# Patient Record
Sex: Female | Born: 1965
Health system: Southern US, Community
[De-identification: ages and names within clinical notes are randomized; demographics above are authoritative.]

## PROBLEM LIST (undated history)

## (undated) DIAGNOSIS — Z8669 Personal history of other diseases of the nervous system and sense organs: Secondary | ICD-10-CM

## (undated) HISTORY — DX: Personal history of other diseases of the nervous system and sense organs: Z86.69

## (undated) HISTORY — PX: WISDOM TOOTH EXTRACTION: SHX21

---

## 2001-04-11 ENCOUNTER — Emergency Department (HOSPITAL_COMMUNITY): Admission: EM | Admit: 2001-04-11 | Discharge: 2001-04-11 | Payer: Self-pay | Admitting: Emergency Medicine

## 2002-01-03 ENCOUNTER — Ambulatory Visit (HOSPITAL_COMMUNITY): Admission: RE | Admit: 2002-01-03 | Discharge: 2002-01-03 | Payer: Self-pay | Admitting: Family Medicine

## 2002-01-03 ENCOUNTER — Encounter: Payer: Self-pay | Admitting: Family Medicine

## 2002-01-11 ENCOUNTER — Ambulatory Visit (HOSPITAL_COMMUNITY): Admission: RE | Admit: 2002-01-11 | Discharge: 2002-01-11 | Payer: Self-pay | Admitting: Gynecology

## 2002-01-11 ENCOUNTER — Encounter: Payer: Self-pay | Admitting: Gynecology

## 2002-02-12 ENCOUNTER — Encounter: Payer: Self-pay | Admitting: Obstetrics and Gynecology

## 2002-02-12 ENCOUNTER — Encounter: Admission: RE | Admit: 2002-02-12 | Discharge: 2002-02-12 | Payer: Self-pay | Admitting: Obstetrics and Gynecology

## 2003-03-05 ENCOUNTER — Encounter: Payer: Self-pay | Admitting: Obstetrics and Gynecology

## 2003-03-05 ENCOUNTER — Encounter: Admission: RE | Admit: 2003-03-05 | Discharge: 2003-03-05 | Payer: Self-pay | Admitting: Obstetrics and Gynecology

## 2003-03-07 ENCOUNTER — Encounter: Admission: RE | Admit: 2003-03-07 | Discharge: 2003-03-07 | Payer: Self-pay | Admitting: Obstetrics and Gynecology

## 2003-03-07 ENCOUNTER — Encounter: Payer: Self-pay | Admitting: Obstetrics and Gynecology

## 2004-06-16 ENCOUNTER — Encounter: Admission: RE | Admit: 2004-06-16 | Discharge: 2004-06-16 | Payer: Self-pay | Admitting: Obstetrics and Gynecology

## 2005-06-21 ENCOUNTER — Encounter: Admission: RE | Admit: 2005-06-21 | Discharge: 2005-06-21 | Payer: Self-pay | Admitting: *Deleted

## 2006-06-28 ENCOUNTER — Encounter: Admission: RE | Admit: 2006-06-28 | Discharge: 2006-06-28 | Payer: Self-pay | Admitting: *Deleted

## 2007-10-09 ENCOUNTER — Encounter: Admission: RE | Admit: 2007-10-09 | Discharge: 2007-10-09 | Payer: Self-pay | Admitting: Family Medicine

## 2009-05-05 ENCOUNTER — Encounter: Admission: RE | Admit: 2009-05-05 | Discharge: 2009-05-05 | Payer: Self-pay | Admitting: Family Medicine

## 2010-06-30 ENCOUNTER — Encounter: Admission: RE | Admit: 2010-06-30 | Discharge: 2010-06-30 | Payer: Self-pay | Admitting: Family Medicine

## 2011-11-03 ENCOUNTER — Other Ambulatory Visit: Payer: Self-pay | Admitting: Family Medicine

## 2011-11-03 DIAGNOSIS — Z1231 Encounter for screening mammogram for malignant neoplasm of breast: Secondary | ICD-10-CM

## 2011-12-02 ENCOUNTER — Ambulatory Visit
Admission: RE | Admit: 2011-12-02 | Discharge: 2011-12-02 | Disposition: A | Discharge status: Home / Self Care | Payer: BC Managed Care – PPO | Source: Ambulatory Visit | Attending: Family Medicine | Admitting: Family Medicine

## 2011-12-02 DIAGNOSIS — Z1231 Encounter for screening mammogram for malignant neoplasm of breast: Secondary | ICD-10-CM

## 2011-12-05 ENCOUNTER — Other Ambulatory Visit: Payer: Self-pay | Admitting: Family Medicine

## 2011-12-05 DIAGNOSIS — R928 Other abnormal and inconclusive findings on diagnostic imaging of breast: Secondary | ICD-10-CM

## 2011-12-06 ENCOUNTER — Ambulatory Visit
Admission: RE | Admit: 2011-12-06 | Discharge: 2011-12-06 | Disposition: A | Discharge status: Home / Self Care | Payer: BC Managed Care – PPO | Source: Ambulatory Visit | Attending: Family Medicine | Admitting: Family Medicine

## 2011-12-06 DIAGNOSIS — R928 Other abnormal and inconclusive findings on diagnostic imaging of breast: Secondary | ICD-10-CM

## 2012-05-14 ENCOUNTER — Other Ambulatory Visit: Payer: Self-pay | Admitting: Family Medicine

## 2012-05-14 DIAGNOSIS — R921 Mammographic calcification found on diagnostic imaging of breast: Secondary | ICD-10-CM

## 2012-06-07 ENCOUNTER — Ambulatory Visit
Admission: RE | Admit: 2012-06-07 | Discharge: 2012-06-07 | Disposition: A | Discharge status: Home / Self Care | Payer: BC Managed Care – PPO | Source: Ambulatory Visit | Attending: Family Medicine | Admitting: Family Medicine

## 2012-06-07 DIAGNOSIS — R921 Mammographic calcification found on diagnostic imaging of breast: Secondary | ICD-10-CM

## 2012-11-06 ENCOUNTER — Other Ambulatory Visit: Payer: Self-pay | Admitting: Family Medicine

## 2012-11-06 DIAGNOSIS — R921 Mammographic calcification found on diagnostic imaging of breast: Secondary | ICD-10-CM

## 2012-12-10 ENCOUNTER — Ambulatory Visit
Admission: RE | Admit: 2012-12-10 | Discharge: 2012-12-10 | Disposition: A | Discharge status: Home / Self Care | Payer: BC Managed Care – PPO | Source: Ambulatory Visit | Attending: Family Medicine | Admitting: Family Medicine

## 2012-12-10 DIAGNOSIS — R921 Mammographic calcification found on diagnostic imaging of breast: Secondary | ICD-10-CM

## 2013-11-26 ENCOUNTER — Other Ambulatory Visit: Payer: Self-pay

## 2013-11-26 DIAGNOSIS — Z1231 Encounter for screening mammogram for malignant neoplasm of breast: Secondary | ICD-10-CM

## 2013-12-12 ENCOUNTER — Ambulatory Visit: Payer: BC Managed Care – PPO

## 2013-12-24 ENCOUNTER — Encounter (INDEPENDENT_AMBULATORY_CARE_PROVIDER_SITE_OTHER): Payer: Self-pay

## 2013-12-24 ENCOUNTER — Ambulatory Visit
Admission: RE | Admit: 2013-12-24 | Discharge: 2013-12-24 | Disposition: A | Discharge status: Home / Self Care | Payer: BC Managed Care – PPO | Source: Ambulatory Visit

## 2013-12-24 DIAGNOSIS — Z1231 Encounter for screening mammogram for malignant neoplasm of breast: Secondary | ICD-10-CM

## 2013-12-25 ENCOUNTER — Other Ambulatory Visit: Payer: Self-pay | Admitting: Family Medicine

## 2013-12-25 DIAGNOSIS — R928 Other abnormal and inconclusive findings on diagnostic imaging of breast: Secondary | ICD-10-CM

## 2014-01-06 ENCOUNTER — Ambulatory Visit
Admission: RE | Admit: 2014-01-06 | Discharge: 2014-01-06 | Disposition: A | Discharge status: Home / Self Care | Payer: BC Managed Care – PPO | Source: Ambulatory Visit | Attending: Family Medicine | Admitting: Family Medicine

## 2014-01-06 DIAGNOSIS — R928 Other abnormal and inconclusive findings on diagnostic imaging of breast: Secondary | ICD-10-CM

## 2014-12-26 ENCOUNTER — Other Ambulatory Visit: Payer: Self-pay

## 2014-12-26 DIAGNOSIS — Z1231 Encounter for screening mammogram for malignant neoplasm of breast: Secondary | ICD-10-CM

## 2015-01-22 ENCOUNTER — Ambulatory Visit: Payer: Self-pay

## 2015-02-18 ENCOUNTER — Ambulatory Visit
Admission: RE | Admit: 2015-02-18 | Discharge: 2015-02-18 | Disposition: A | Discharge status: Home / Self Care | Payer: 59 | Source: Ambulatory Visit

## 2015-02-18 DIAGNOSIS — Z1231 Encounter for screening mammogram for malignant neoplasm of breast: Secondary | ICD-10-CM

## 2015-06-24 ENCOUNTER — Encounter: Payer: Self-pay | Admitting: Obstetrics and Gynecology

## 2015-07-08 ENCOUNTER — Ambulatory Visit (INDEPENDENT_AMBULATORY_CARE_PROVIDER_SITE_OTHER): Payer: 59 | Admitting: Obstetrics and Gynecology

## 2015-07-08 ENCOUNTER — Encounter: Payer: Self-pay | Admitting: Obstetrics and Gynecology

## 2015-07-08 VITALS — BP 110/60 | HR 48 | Resp 14 | Ht 65.0 in | Wt 144.0 lb

## 2015-07-08 DIAGNOSIS — N941 Unspecified dyspareunia: Secondary | ICD-10-CM | POA: Diagnosis not present

## 2015-07-08 DIAGNOSIS — Z124 Encounter for screening for malignant neoplasm of cervix: Secondary | ICD-10-CM | POA: Diagnosis not present

## 2015-07-08 DIAGNOSIS — Z01419 Encounter for gynecological examination (general) (routine) without abnormal findings: Secondary | ICD-10-CM | POA: Diagnosis not present

## 2015-07-08 DIAGNOSIS — N952 Postmenopausal atrophic vaginitis: Secondary | ICD-10-CM | POA: Diagnosis not present

## 2015-07-08 MED ORDER — ESTRADIOL 0.1 MG/GM VA CREA: TOPICAL_CREAM | VAGINAL | Status: DC

## 2015-07-08 NOTE — Progress Notes (Signed)
Patient ID: Julie Dorsey, female   DOB: 1966-01-18, 49 y.o.   MRN: [id number]  49 y.o. [id number] MarriedCaucasianF here for annual exam.  She is postmenopausal. No vaginal bleeding. No night sweats. She is having vaginal dryness and pain on entry with intercourse. Some sleep issues. As long as she is eating healthy and exercising the hot flashes are okay.     No LMP recorded. Patient is postmenopausal.          Sexually active: Yes.    The current method of family planning is post menopausal status.    Exercising: Yes.    running walking Smoker:  no  Health Maintenance: Pap:  2014 WNL per patient  History of abnormal Pap:  no MMG:  02-18-15 WNL Colonoscopy:  08/2014 polyp - repeat in 5 years ([facility])  BMD:  Never TDaP:  2010 Gardasil: N/A   reports that she has never smoked. She has never used smokeless tobacco. She reports that she drinks alcohol. She reports that she does not use illicit drugs. 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 73 year old son is a second year medical school. Daughter is in her second year of college, wants to be a Marine scientist.   History reviewed. No pertinent past medical history.  Past Surgical History  Procedure Laterality Date  . Wisdom tooth extraction      Current Outpatient Prescriptions  Medication Sig Dispense Refill  . Calcium Citrate-Vitamin D (CALCIUM + D PO) Take by mouth.    . Melatonin 10 MG CAPS Take 10 mg by mouth at bedtime.    . Multiple Vitamin (MULTIVITAMIN) capsule Take 1 capsule by mouth daily.     No current facility-administered medications for this visit.    Family History  Problem Relation Age of Onset  . Breast cancer Mother 79  . Cancer Father 57    ureter cancer  . Heart disease Maternal Grandmother   . Heart disease Maternal Grandfather   . Heart disease Paternal Grandmother   . Heart disease Paternal Jon Gills   Mom is alive and well. She was pre-menopausal, she was BRCA negative.  Father died 23 months of  diagnosis of ureteral cancer, died 44 years ago.  Review of Systems  Constitutional: Negative.   HENT: Negative.   Eyes: Negative.   Respiratory: Negative.   Cardiovascular: Negative.   Gastrointestinal: Negative.   Endocrine: Negative.   Genitourinary: Negative.   Musculoskeletal: Negative.   Skin: Negative.   Allergic/Immunologic: Negative.   Neurological: Negative.   Psychiatric/Behavioral: Negative.     Exam:   BP 110/60 mmHg  Pulse 48  Resp 14  Ht 5' 5"  (1.651 m)  Wt 144 lb (65.318 kg)  BMI 23.96 kg/m2  Weight change: @WEIGHTCHANGE @ Height:   Height: 5' 5"  (165.1 cm)  Ht Readings from Last 3 Encounters:  07/08/15 5' 5"  (1.651 m)    General appearance: alert, cooperative and appears stated age Head: Normocephalic, without obvious abnormality, atraumatic Neck: no adenopathy, supple, symmetrical, trachea midline and thyroid normal to inspection and palpation Lungs: clear to auscultation bilaterally Breasts: normal appearance, no masses or tenderness Heart: regular rate and rhythm Abdomen: soft, non-tender; bowel sounds normal; no masses,  no organomegaly Extremities: extremities normal, atraumatic, no cyanosis or edema Skin: Skin color, texture, turgor normal. No rashes or lesions Lymph nodes: Cervical, supraclavicular, and axillary nodes normal. No abnormal inguinal nodes palpated Neurologic: Grossly normal   Pelvic: External genitalia:  no lesions  Urethra:  normal appearing urethra with no masses, tenderness or lesions              Bartholins and Skenes: normal                 Vagina: atrophic vaginal mucosa              Cervix: no lesions               Bimanual Exam:  Uterus:  normal size, contour, position, consistency, mobility, non-tender              Adnexa: no mass, fullness, tenderness               Rectovaginal: Confirms               Anus:  normal sphincter tone, no lesions  Chaperone was present for exam.  A:  Well Woman with normal  exam  PMP  Dyspareunia  Atrophic vaginitis  P:   Pap with hpv  Mammogram UTD  Colonoscopy UTD  Continue calcium and vit D  Continue breast self exams  Start estrace vaginal cream  Continue with vaginal lubrication with intercourse

## 2015-07-08 NOTE — Patient Instructions (Signed)

## 2015-07-13 LAB — IPS PAP TEST WITH HPV

## 2016-01-21 ENCOUNTER — Other Ambulatory Visit: Payer: Self-pay | Admitting: Family Medicine

## 2016-01-21 DIAGNOSIS — Z1231 Encounter for screening mammogram for malignant neoplasm of breast: Secondary | ICD-10-CM

## 2016-02-26 ENCOUNTER — Ambulatory Visit
Admission: RE | Admit: 2016-02-26 | Discharge: 2016-02-26 | Disposition: A | Discharge status: Home / Self Care | Payer: 59 | Source: Ambulatory Visit | Attending: Family Medicine | Admitting: Family Medicine

## 2016-02-26 DIAGNOSIS — Z1231 Encounter for screening mammogram for malignant neoplasm of breast: Secondary | ICD-10-CM

## 2016-07-21 ENCOUNTER — Encounter: Payer: Self-pay | Admitting: Obstetrics and Gynecology

## 2016-07-21 ENCOUNTER — Ambulatory Visit (INDEPENDENT_AMBULATORY_CARE_PROVIDER_SITE_OTHER): Payer: 59 | Admitting: Obstetrics and Gynecology

## 2016-07-21 VITALS — BP 106/66 | HR 60 | Resp 16 | Ht 65.25 in | Wt 146.0 lb

## 2016-07-21 DIAGNOSIS — Z0000 Encounter for general adult medical examination without abnormal findings: Secondary | ICD-10-CM | POA: Diagnosis not present

## 2016-07-21 DIAGNOSIS — R6889 Other general symptoms and signs: Secondary | ICD-10-CM

## 2016-07-21 DIAGNOSIS — Z01419 Encounter for gynecological examination (general) (routine) without abnormal findings: Secondary | ICD-10-CM | POA: Diagnosis not present

## 2016-07-21 LAB — COMPREHENSIVE METABOLIC PANEL
ALT: 28 U/L (ref 6–29)
AST: 31 U/L (ref 10–35)
Albumin: 4.3 g/dL (ref 3.6–5.1)
Alkaline Phosphatase: 47 U/L (ref 33–130)
BUN: 12 mg/dL (ref 7–25)
CO2: 28 mmol/L (ref 20–31)
Calcium: 9.5 mg/dL (ref 8.6–10.4)
Chloride: 103 mmol/L (ref 98–110)
Creat: 0.87 mg/dL (ref 0.50–1.05)
Glucose, Bld: 81 mg/dL (ref 65–99)
Potassium: 4.7 mmol/L (ref 3.5–5.3)
Sodium: 139 mmol/L (ref 135–146)
Total Bilirubin: 0.5 mg/dL (ref 0.2–1.2)
Total Protein: 6.5 g/dL (ref 6.1–8.1)

## 2016-07-21 LAB — LIPID PANEL
Cholesterol: 219 mg/dL — ABNORMAL HIGH (ref <200)
HDL: 108 mg/dL (ref >50)
LDL Cholesterol: 99 mg/dL (ref <100)
Total CHOL/HDL Ratio: 2 Ratio (ref <5.0)
Triglycerides: 59 mg/dL (ref <150)
VLDL: 12 mg/dL (ref <30)

## 2016-07-21 LAB — CBC
HCT: 40.5 % (ref 35.0–45.0)
Hemoglobin: 13 g/dL (ref 11.7–15.5)
MCH: 30.8 pg (ref 27.0–33.0)
MCHC: 32.1 g/dL (ref 32.0–36.0)
MCV: 96 fL (ref 80.0–100.0)
MPV: 11.5 fL (ref 7.5–12.5)
Platelets: 249 10*3/uL (ref 140–400)
RBC: 4.22 MIL/uL (ref 3.80–5.10)
RDW: 13.9 % (ref 11.0–15.0)
WBC: 6.2 10*3/uL (ref 3.8–10.8)

## 2016-07-21 NOTE — Progress Notes (Signed)
50 y.o. [id number] MarriedCaucasianF here for annual exam. She c/o increased acne for the last 6 months. No bleeding. LMP 3-5 years ago. No night sweats, hot flashes are getting a little better. She has some heat intolerance. 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 Her primary gave her an inhaler.  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 She will go back and see her dermatologist again.  She has some pain with intercourse, lubricant helps. Only having intermittent deep dyspareunia, positional.     No LMP recorded. Patient is postmenopausal.          Sexually active: Yes.    The current method of family planning is post menopausal status.    Exercising: Yes.    running Smoker:  no  Health Maintenance: Pap:  07/08/15 Neg. HR HPV:neg  History of abnormal Pap:  no MMG:  02/26/16 BIRADS1:neg  Colonoscopy:  08/2013 polyps- f/u 5 years  BMD:   never TDaP:  2010   reports that she has never smoked. She has never used smokeless tobacco. She reports that she drinks alcohol. She reports that she does not use drugs. 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 19 year old son is in his third year of medical school. Daughter is in her third year of college, wants to be a Marine scientist. Rare ETOH use.   History reviewed. No pertinent past medical history.  Past Surgical History:  Procedure Laterality Date  . WISDOM TOOTH EXTRACTION      Current Outpatient Prescriptions  Medication Sig Dispense Refill  . albuterol (PROVENTIL HFA;VENTOLIN HFA) 108 (90 Base) MCG/ACT inhaler Inhale 2 puffs into the lungs as needed for wheezing or shortness of breath.    . Calcium Citrate-Vitamin D (CALCIUM + D PO) Take by mouth.    . Melatonin 10 MG CAPS Take 10 mg by mouth at bedtime.    . Multiple Vitamin (MULTIVITAMIN) capsule Take 1 capsule by mouth daily.     No current facility-administered medications for this visit.     Family History  Problem Relation Age of  Onset  . Breast cancer Mother 32  . Cancer Father 56    ureter cancer  . Heart disease Maternal Grandmother   . Heart disease Maternal Grandfather   . Heart disease Paternal Grandmother   . Heart disease Paternal Grandfather     Review of Systems  Constitutional: Negative.   HENT: Negative.   Eyes: Negative.   Respiratory: Negative.   Cardiovascular: Negative.   Gastrointestinal: Negative.   Endocrine: Positive for cold intolerance and heat intolerance.  Genitourinary: Negative.   Musculoskeletal: Negative.   Skin: Positive for rash.  Allergic/Immunologic: Negative.   Neurological: Negative.   Hematological: Negative.   Psychiatric/Behavioral: Negative.     Exam:   BP 106/66 (BP Location: Right Arm, Patient Position: Sitting, Cuff Size: Normal)   Pulse 60   Resp 16   Ht 5' 5.25" (1.657 m)   Wt 146 lb (66.2 kg)   BMI 24.11 kg/m   Weight change: @WEIGHTCHANGE @ Height:   Height: 5' 5.25" (165.7 cm)  Ht Readings from Last 3 Encounters:  07/21/16 5' 5.25" (1.657 m)  07/08/15 5\' 5"  (1.651 m)    General appearance: alert, cooperative and appears stated age Head: Normocephalic, without obvious abnormality, atraumatic Neck: no adenopathy, supple, symmetrical, trachea midline and thyroid normal to inspection and palpation Lungs: clear to auscultation bilaterally Breasts: normal appearance, no masses  or tenderness Heart: regular rate and rhythm Abdomen: soft, non-tender; bowel sounds normal; no masses,  no organomegaly Extremities: extremities normal, atraumatic, no cyanosis or edema Skin: Skin color, texture, turgor normal. No rashes or lesions Lymph nodes: Cervical, supraclavicular, and axillary nodes normal. No abnormal inguinal nodes palpated Neurologic: Grossly normal   Pelvic: External genitalia:  no lesions              Urethra:  normal appearing urethra with no masses, tenderness or lesions              Bartholins and Skenes: normal                 Vagina: normal  appearing atrophic vagina with normal color and discharge, no lesions              Cervix: no lesions               Bimanual Exam:  Uterus:  normal size, contour, position, consistency, mobility, non-tender              Adnexa: no mass, fullness, tenderness               Rectovaginal: Confirms               Anus:  normal sphincter tone, no lesions  Chaperone was present for exam.  A:  Well Woman with normal exam  Heat intolerance  P:   No pap this year  Discussed breast self exam  Discussed calcium and vit D intake  Mammogram utd  Colonoscopy UTD  Screening labs and TSH

## 2016-07-21 NOTE — Patient Instructions (Signed)

## 2016-07-22 LAB — TSH: TSH: 1.34 mIU/L

## 2016-07-22 LAB — VITAMIN D 25 HYDROXY (VIT D DEFICIENCY, FRACTURES): Vit D, 25-Hydroxy: 39 ng/mL (ref 30–100)

## 2016-07-26 ENCOUNTER — Encounter: Payer: Self-pay | Admitting: Obstetrics and Gynecology

## 2016-07-26 ENCOUNTER — Telehealth: Payer: Self-pay

## 2016-07-26 NOTE — Telephone Encounter (Signed)
Telephone encounter sent to [place] for review of lab results from 07/21/2016.

## 2016-07-26 NOTE — Telephone Encounter (Signed)
Visit Follow-Up Question  Message [id number]  From [name] To Salvadore Dom, MD Sent 07/26/2016 4:12 PM  Hi,  Please send my lab results drawn on 07/21/16.  Thank you,  Julie Dorsey   Responsible Party   Pool - [facility] Pool No one has taken responsibility for this message.  No actions have been taken on this message.   Dr.Jertson, okay to inform patient all labs are normal from 07/21/2016 except elevated cholesterol level? Okay to advise will need to reduce cholesterol intake in diet and increase exercise?

## 2016-07-27 NOTE — Telephone Encounter (Signed)
I sent her a mychart message this morning. Thanks. Will close the encounter

## 2016-07-28 DIAGNOSIS — H52223 Regular astigmatism, bilateral: Secondary | ICD-10-CM | POA: Diagnosis not present

## 2016-07-28 DIAGNOSIS — H524 Presbyopia: Secondary | ICD-10-CM | POA: Diagnosis not present

## 2016-07-28 DIAGNOSIS — H5213 Myopia, bilateral: Secondary | ICD-10-CM | POA: Diagnosis not present

## 2016-08-12 DIAGNOSIS — J45990 Exercise induced bronchospasm: Secondary | ICD-10-CM | POA: Diagnosis not present

## 2016-08-12 DIAGNOSIS — Z0000 Encounter for general adult medical examination without abnormal findings: Secondary | ICD-10-CM | POA: Diagnosis not present

## 2016-08-12 MED FILL — metroNIDAZOLE 0.75 % CREA: 0.75 | 7 days supply | Qty: 45 | Fill #0

## 2016-08-12 MED FILL — VENTOLIN HFA 90 MCG INHALER: 108 (90 BAS | 30 days supply | Qty: 18 | Fill #0

## 2016-08-12 MED FILL — clonazePAM 0.5 MG TABS: 0.5 | 30 days supply | Qty: 30 | Fill #0

## 2016-12-12 MED FILL — clonazePAM 0.5 MG TABS: 0.5 | 30 days supply | Qty: 30 | Fill #1

## 2016-12-26 DIAGNOSIS — D229 Melanocytic nevi, unspecified: Secondary | ICD-10-CM | POA: Diagnosis not present

## 2016-12-26 DIAGNOSIS — L719 Rosacea, unspecified: Secondary | ICD-10-CM | POA: Diagnosis not present

## 2016-12-26 DIAGNOSIS — L814 Other melanin hyperpigmentation: Secondary | ICD-10-CM | POA: Diagnosis not present

## 2016-12-26 DIAGNOSIS — D239 Other benign neoplasm of skin, unspecified: Secondary | ICD-10-CM | POA: Diagnosis not present

## 2016-12-26 DIAGNOSIS — D1801 Hemangioma of skin and subcutaneous tissue: Secondary | ICD-10-CM | POA: Diagnosis not present

## 2016-12-26 DIAGNOSIS — L821 Other seborrheic keratosis: Secondary | ICD-10-CM | POA: Diagnosis not present

## 2016-12-26 DIAGNOSIS — D225 Melanocytic nevi of trunk: Secondary | ICD-10-CM | POA: Diagnosis not present

## 2017-01-24 MED FILL — clonazePAM 0.5 MG TABS: 0.5 | 30 days supply | Qty: 30 | Fill #2

## 2017-03-28 ENCOUNTER — Other Ambulatory Visit: Payer: Self-pay | Admitting: Family Medicine

## 2017-03-28 DIAGNOSIS — Z1231 Encounter for screening mammogram for malignant neoplasm of breast: Secondary | ICD-10-CM

## 2017-04-22 HISTORY — PX: DIGIT NAIL REMOVAL: SHX5052

## 2017-05-15 ENCOUNTER — Ambulatory Visit (INDEPENDENT_AMBULATORY_CARE_PROVIDER_SITE_OTHER): Payer: 59 | Admitting: Podiatry

## 2017-05-15 ENCOUNTER — Other Ambulatory Visit: Payer: Self-pay

## 2017-05-15 ENCOUNTER — Encounter: Payer: Self-pay | Admitting: Podiatry

## 2017-05-15 DIAGNOSIS — L03039 Cellulitis of unspecified toe: Secondary | ICD-10-CM | POA: Diagnosis not present

## 2017-05-15 MED ORDER — DOXYCYCLINE HYCLATE 100 MG PO TABS: 100.0000 mg | ORAL_TABLET | Freq: Two times a day (BID) | ORAL | 0 refills | Status: DC

## 2017-05-15 MED FILL — DOXYCYCLINE HYCLATE 100 MG: 100 | 7 days supply | Qty: 14 | Fill #0

## 2017-05-15 MED FILL — clonazePAM 0.5 MG TABS: 0.5 | 30 days supply | Qty: 30 | Fill #0

## 2017-05-15 NOTE — Addendum Note (Signed)
Addended by: Edrick Kins on: 05/15/2017 11:10 AM   Modules accepted: Orders

## 2017-05-15 NOTE — Addendum Note (Signed)
Addended by: Edrick Kins on: 05/15/2017 11:20 AM   Modules accepted: Orders

## 2017-05-15 NOTE — Patient Instructions (Signed)

## 2017-05-15 NOTE — Progress Notes (Signed)
   Subjective:    Patient ID: Julie Dorsey, female    DOB: 05-26-1966, 51 y.o.   MRN: [id number]  HPI    Review of Systems  Skin: Positive for color change.       Change in nails  All other systems reviewed and are negative.      Objective:   Physical Exam        Assessment & Plan:

## 2017-05-15 NOTE — Progress Notes (Addendum)
Patient ID: Julie Dorsey, female   DOB: [birth year], 51 y.o.   MRN: [id number]   HPI: 51 year old female otherwise healthy presents today for evaluation of an infection regarding her right great toenail. Patient is training for a marathon has been riding several miles. She noticed some pain and tenderness during one of her runs. She also got stepped on recently over the weekend at a wedding. Nail toe presents today with red discoloration and is exquisitely painful.  No past medical history on file.   Physical Exam: General: The patient is alert and oriented x3 in no acute distress.  Dermatology: Partial detachment of the nail plate and underlying nailbed noted to the right great toenail. There is periwound erythema with likely abscess to the medial border of the nail fold. Skin is warm, dry and supple bilateral lower extremities. Negative for open lesions or macerations.  Vascular: Palpable pedal pulses bilaterally. No edema or erythema noted. Capillary refill within normal limits.  Neurological: Epicritic and protective threshold grossly intact bilaterally.   Musculoskeletal Exam: Range of motion within normal limits to all pedal and ankle joints bilateral. Muscle strength 5/5 in all groups bilateral.   Radiographic Exam:  Normal osseous mineralization. Joint spaces preserved. No fracture/dislocation/boney destruction.    Assessment: -  Toenail infection right great toe secondary to trauma   Plan of Care:  - Patient was evaluated today. - Recommend that the patient has the toenail completely removed and let a new nail regrowth. Patient agrees. Total temporary nail avulsion was performed to the right great toe. Toe was prepped and aseptic manner and local anesthesia infiltration was utilized prior to procedure.  -  dry sterile dressing was applied. Recommend daily Epsom salts foot soaks and antibiotic ointment with a Band-Aid. 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    Edrick Kins, DPM [facility]  Dr. Edrick Kins, DPM    [address], [place] [address]                Office [phone]  Fax [phone]

## 2017-05-16 ENCOUNTER — Ambulatory Visit: Payer: 59

## 2017-06-05 ENCOUNTER — Ambulatory Visit: Payer: 59 | Admitting: Podiatry

## 2017-06-26 MED FILL — VENTOLIN HFA 90 MCG INHALER: 108 (90 BAS | 30 days supply | Qty: 18 | Fill #1

## 2017-07-04 ENCOUNTER — Ambulatory Visit
Admission: RE | Admit: 2017-07-04 | Discharge: 2017-07-04 | Disposition: A | Discharge status: Home / Self Care | Payer: 59 | Source: Ambulatory Visit | Attending: Family Medicine | Admitting: Family Medicine

## 2017-07-04 DIAGNOSIS — Z1231 Encounter for screening mammogram for malignant neoplasm of breast: Secondary | ICD-10-CM

## 2017-07-25 DIAGNOSIS — H524 Presbyopia: Secondary | ICD-10-CM | POA: Diagnosis not present

## 2017-07-25 DIAGNOSIS — H52221 Regular astigmatism, right eye: Secondary | ICD-10-CM | POA: Diagnosis not present

## 2017-07-25 DIAGNOSIS — H5213 Myopia, bilateral: Secondary | ICD-10-CM | POA: Diagnosis not present

## 2017-07-25 MED FILL — clonazePAM 0.5 MG TABS: 0.5 | 30 days supply | Qty: 30 | Fill #1

## 2017-08-01 ENCOUNTER — Telehealth: Payer: Self-pay | Admitting: Obstetrics and Gynecology

## 2017-08-01 NOTE — Telephone Encounter (Signed)
aex /nr 08/01/17 lm re: Dr. Talbert Nan cx due to inclement weather/[place]

## 2017-08-02 ENCOUNTER — Ambulatory Visit: Payer: 59 | Admitting: Obstetrics and Gynecology

## 2017-08-31 ENCOUNTER — Other Ambulatory Visit: Payer: Self-pay

## 2017-08-31 ENCOUNTER — Encounter: Payer: Self-pay | Admitting: Obstetrics and Gynecology

## 2017-08-31 ENCOUNTER — Ambulatory Visit (INDEPENDENT_AMBULATORY_CARE_PROVIDER_SITE_OTHER): Payer: 59 | Admitting: Obstetrics and Gynecology

## 2017-08-31 VITALS — BP 118/60 | HR 60 | Resp 14 | Ht 65.0 in | Wt 150.0 lb

## 2017-08-31 DIAGNOSIS — Z0000 Encounter for general adult medical examination without abnormal findings: Secondary | ICD-10-CM

## 2017-08-31 DIAGNOSIS — Z01419 Encounter for gynecological examination (general) (routine) without abnormal findings: Secondary | ICD-10-CM

## 2017-08-31 DIAGNOSIS — N8111 Cystocele, midline: Secondary | ICD-10-CM | POA: Diagnosis not present

## 2017-08-31 NOTE — Progress Notes (Signed)
52 y.o. [id number] MarriedCaucasianF here for annual exam. Patient c/o pelvic pain X 1 month. She ran a marathon in 11/18 and a 1/2 marathon in 12/18. She has muscular pain in her pelvis. If she backs of on running she feels better. Not hurting right now. No vaginal bleeding, no abdominal pain. Only hurts with movement. She has had slight pain with intercourse since the 1/2 marathon, mild.  She has some issues falling asleep. Stays asleep. Usually she is wound up.     No LMP recorded. Patient is postmenopausal.          Sexually active: Yes.    The current method of family planning is post menopausal status.    Exercising: Yes.    running/ walking/ strength exercise Smoker:  no  Health Maintenance: Pap:  07-08-15 WNL NEG HR HPV  History of abnormal Pap:  no MMG:  07-04-17 WNL  Colonoscopy:  2016 polyp- repeat in 5 years  BMD:   Never TDaP:  Up to date  Gardasil: N/A   reports that  has never smoked. she has never used smokeless tobacco. She reports that she drinks alcohol. She reports that she does not use drugs. Rare ETOH. 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 Son is a 3rd year med student, getting married this summer. Her daughter is in college, will graduate next December. Majoring in health care management.   History reviewed. No pertinent past medical history.  Past Surgical History:  Procedure Laterality Date  . DIGIT NAIL REMOVAL Right 04/2017   right toe nail removed  . WISDOM TOOTH EXTRACTION      Current Outpatient Medications  Medication Sig Dispense Refill  . albuterol (PROVENTIL HFA;VENTOLIN HFA) 108 (90 Base) MCG/ACT inhaler Inhale 2 puffs into the lungs as needed for wheezing or shortness of breath.    . Calcium Citrate-Vitamin D (CALCIUM + D PO) Take by mouth.    . clonazePAM (KLONOPIN) 0.5 MG tablet   3  . Melatonin 10 MG CAPS Take 10 mg by mouth at bedtime.    . Multiple Vitamin (MULTIVITAMIN) capsule Take 1 capsule by mouth daily.     No  current facility-administered medications for this visit.     Family History  Problem Relation Age of Onset  . Breast cancer Mother 11  . Cancer Father 66       ureter cancer  . Heart disease Maternal Grandmother   . Heart disease Maternal Grandfather   . Heart disease Paternal Grandmother   . Heart disease Paternal Grandfather     Review of Systems  Constitutional: Negative.   HENT: Negative.   Eyes: Negative.   Respiratory: Negative.   Cardiovascular: Negative.   Gastrointestinal: Negative.   Endocrine: Negative.   Genitourinary: Positive for pelvic pain.  Musculoskeletal: Positive for myalgias.  Skin: Negative.   Allergic/Immunologic: Negative.   Neurological: Negative.   Psychiatric/Behavioral: Negative.     Exam:   BP 118/60 (BP Location: Right Arm, Patient Position: Sitting, Cuff Size: Normal)   Pulse 60   Resp 14   Ht 5\' 5"  (1.651 m)   Wt 150 lb (68 kg)   BMI 24.96 kg/m   Weight change: @WEIGHTCHANGE @ Height:   Height: 5\' 5"  (165.1 cm)  Ht Readings from Last 3 Encounters:  08/31/17 5\' 5"  (1.651 m)  07/21/16 5' 5.25" (1.657 m)  07/08/15 5\' 5"  (1.651 m)    General appearance: alert, cooperative and appears stated age Head: Normocephalic, without obvious abnormality, atraumatic  Neck: no adenopathy, supple, symmetrical, trachea midline and thyroid normal to inspection and palpation Lungs: clear to auscultation bilaterally Cardiovascular: regular rate and rhythm Breasts: normal appearance, no masses or tenderness Abdomen: soft, non-tender; non distended,  no masses,  no organomegaly Extremities: extremities normal, atraumatic, no cyanosis or edema Skin: Skin color, texture, turgor normal. No rashes or lesions Lymph nodes: Cervical, supraclavicular, and axillary nodes normal. No abnormal inguinal nodes palpated Neurologic: Grossly normal   Pelvic: External genitalia:  no lesions              Urethra:  normal appearing urethra with no masses, tenderness or  lesions              Bartholins and Skenes: normal                 Vagina: normal appearing atrophic vagina with a grade 1 cystocele              Cervix: no lesions               Bimanual Exam:  Uterus:  normal size, contour, position, consistency, mobility, non-tender              Adnexa: no mass, fullness, tenderness               Rectovaginal: Confirms               Anus:  normal sphincter tone, no lesions  Chaperone was present for exam.  A:  Well Woman with normal exam  Pelvic pain, MS, since her marathon, pelvic floor not tender. Improving with rest   Mild cystocele, not symptomatic  P:   No pap this year  Mammogram and colonoscopy UTD  Discussed breast self exam  Discussed calcium and vit D intake  Screening labs  Discussed cystocele, not symptomatic, call with concerns  Discussed trying to avoid heavy lifting or straining

## 2017-08-31 NOTE — Patient Instructions (Addendum)
EXERCISE AND DIET:  We recommended that you start or continue a regular exercise program for good health. Regular exercise means any activity that makes your heart beat faster and makes you sweat.  We recommend exercising at least 30 minutes per day at least 3 days a week, preferably 4 or 5.  We also recommend a diet low in fat and sugar.  Inactivity, poor dietary choices and obesity can cause diabetes, heart attack, stroke, and kidney damage, among others.    ALCOHOL AND SMOKING:  Women should limit their alcohol intake to no more than 7 drinks/beers/glasses of wine (combined, not each!) per week. Moderation of alcohol intake to this level decreases your risk of breast cancer and liver damage. And of course, no recreational drugs are part of a healthy lifestyle.  And absolutely no smoking or even second hand smoke. Most people know smoking can cause heart and lung diseases, but did you know it also contributes to weakening of your bones? Aging of your skin?  Yellowing of your teeth and nails?  CALCIUM AND VITAMIN D:  Adequate intake of calcium and Vitamin D are recommended.  The recommendations for exact amounts of these supplements seem to change often, but generally speaking 600 mg of calcium (either carbonate or citrate) and 800 units of Vitamin D per day seems prudent. Certain women may benefit from higher intake of Vitamin D.  If you are among these women, your doctor will have told you during your visit.    PAP SMEARS:  Pap smears, to check for cervical cancer or precancers,  have traditionally been done yearly, although recent scientific advances have shown that most women can have pap smears less often.  However, every woman still should have a physical exam from her gynecologist every year. It will include a breast check, inspection of the vulva and vagina to check for abnormal growths or skin changes, a visual exam of the cervix, and then an exam to evaluate the size and shape of the uterus and  ovaries.  And after 53 years of age, a rectal exam is indicated to check for rectal cancers. We will also provide age appropriate advice regarding health maintenance, like when you should have certain vaccines, screening for sexually transmitted diseases, bone density testing, colonoscopy, mammograms, etc.   MAMMOGRAMS:  All women over 40 years old should have a yearly mammogram. Many facilities now offer a "3D" mammogram, which may cost around $50 extra out of pocket. If possible,  we recommend you accept the option to have the 3D mammogram performed.  It both reduces the number of women who will be called back for extra views which then turn out to be normal, and it is better than the routine mammogram at detecting truly abnormal areas.    COLONOSCOPY:  Colonoscopy to screen for colon cancer is recommended for all women at age 50.  We know, you hate the idea of the prep.  We agree, BUT, having colon cancer and not knowing it is worse!!  Colon cancer so often starts as a polyp that can be seen and removed at colonscopy, which can quite literally save your life!  And if your first colonoscopy is normal and you have no family history of colon cancer, most women don't have to have it again for 10 years.  Once every ten years, you can do something that may end up saving your life, right?  We will be happy to help you get it scheduled when you are ready.    Be sure to check your insurance coverage so you understand how much it will cost.  It may be covered as a preventative service at no cost, but you should check your particular policy.     About Cystocele  Overview  The pelvic organs, including the bladder, are normally supported by pelvic floor muscles and ligaments.  When these muscles and ligaments are stretched, weakened or torn, the wall between the bladder and the vagina sags or herniates causing a prolapse, sometimes called a cystocele.  This condition may cause discomfort and problems with emptying  the bladder.  It can be present in various stages.  Some people are not aware of the changes.  Others may notice changes at the vaginal opening or a feeling of the bladder dropping outside the body.  Causes of a Cystocele  A cystocele is usually caused by muscle straining or stretching during childbirth.  In addition, cystocele is more common after menopause, because the hormone estrogen helps keep the elastic tissues around the pelvic organs strong.  A cystocele is more likely to occur when levels of estrogen decrease.  Other causes include: heavy lifting, chronic coughing, previous pelvic surgery and obesity.  Symptoms  A bladder that has dropped from its normal position may cause: unwanted urine leakage (stress incontinence), frequent urination or urge to urinate, incomplete emptying of the bladder (not feeling bladder relief after emptying), pain or discomfort in the vagina, pelvis, groin, lower back or lower abdomen and frequent urinary tract infections.  Mild cases may not cause any symptoms.  Treatment Options  Pelvic floor (Kegel) exercises:  Strength training the muscles in your genital area  Behavioral changes: Treating and preventing constipation, taking time to empty your bladder properly, learning to lift properly and/or avoid heavy lifting when possible, stopping smoking, avoiding weight gain and treating a chronic cough or bronchitis.  A pessary: A vaginal support device is sometimes used to help pelvic support caused by muscle and ligament changes.  Surgery: Surgical repair may be necessary if symptoms cannot be managed with exercise, behavioral changes and a pessary.  Surgery is usually considered for severe cases.  Your cystocele is mild, no treatment is needed

## 2017-09-01 LAB — COMPREHENSIVE METABOLIC PANEL
ALT: 23 IU/L (ref 0–32)
AST: 28 IU/L (ref 0–40)
Albumin/Globulin Ratio: 1.9 (ref 1.2–2.2)
Albumin: 4.4 g/dL (ref 3.5–5.5)
Alkaline Phosphatase: 56 IU/L (ref 39–117)
BUN/Creatinine Ratio: 16 (ref 9–23)
BUN: 13 mg/dL (ref 6–24)
Bilirubin Total: 0.4 mg/dL (ref 0.0–1.2)
CO2: 26 mmol/L (ref 20–29)
Calcium: 9.5 mg/dL (ref 8.7–10.2)
Chloride: 104 mmol/L (ref 96–106)
Creatinine, Ser: 0.8 mg/dL (ref 0.57–1.00)
GFR calc Af Amer: 97 mL/min/{1.73_m2} (ref >59)
GFR calc non Af Amer: 84 mL/min/{1.73_m2} (ref >59)
Globulin, Total: 2.3 g/dL (ref 1.5–4.5)
Glucose: 85 mg/dL (ref 65–99)
Potassium: 4.3 mmol/L (ref 3.5–5.2)
Sodium: 144 mmol/L (ref 134–144)
Total Protein: 6.7 g/dL (ref 6.0–8.5)

## 2017-09-01 LAB — LIPID PANEL
Chol/HDL Ratio: 2.3 ratio (ref 0.0–4.4)
Cholesterol, Total: 245 mg/dL — ABNORMAL HIGH (ref 100–199)
HDL: 106 mg/dL (ref >39)
LDL Calculated: 122 mg/dL — ABNORMAL HIGH (ref 0–99)
Triglycerides: 84 mg/dL (ref 0–149)
VLDL Cholesterol Cal: 17 mg/dL (ref 5–40)

## 2017-09-01 LAB — CBC
Hematocrit: 40.5 % (ref 34.0–46.6)
Hemoglobin: 13.4 g/dL (ref 11.1–15.9)
MCH: 30.5 pg (ref 26.6–33.0)
MCHC: 33.1 g/dL (ref 31.5–35.7)
MCV: 92 fL (ref 79–97)
Platelets: 283 10*3/uL (ref 150–379)
RBC: 4.4 x10E6/uL (ref 3.77–5.28)
RDW: 13.8 % (ref 12.3–15.4)
WBC: 4.9 10*3/uL (ref 3.4–10.8)

## 2017-10-02 DIAGNOSIS — Z0000 Encounter for general adult medical examination without abnormal findings: Secondary | ICD-10-CM | POA: Diagnosis not present

## 2017-10-02 DIAGNOSIS — J45990 Exercise induced bronchospasm: Secondary | ICD-10-CM | POA: Diagnosis not present

## 2017-10-02 DIAGNOSIS — E782 Mixed hyperlipidemia: Secondary | ICD-10-CM | POA: Diagnosis not present

## 2017-10-02 DIAGNOSIS — N8110 Cystocele, unspecified: Secondary | ICD-10-CM | POA: Diagnosis not present

## 2017-10-20 ENCOUNTER — Encounter: Payer: Self-pay | Admitting: Obstetrics and Gynecology

## 2017-10-20 ENCOUNTER — Telehealth: Payer: Self-pay | Admitting: Obstetrics and Gynecology

## 2017-10-20 DIAGNOSIS — R102 Pelvic and perineal pain: Secondary | ICD-10-CM

## 2017-10-20 NOTE — Telephone Encounter (Signed)
Sent to [place] by accident. Routing to Triage.

## 2017-10-20 NOTE — Telephone Encounter (Signed)
Yes, please refer her to [facility]

## 2017-10-20 NOTE — Telephone Encounter (Signed)
Dr. Talbert Nan, ok to proceed with referral to [facility] at [facility] for pelvic pain?

## 2017-10-20 NOTE — Telephone Encounter (Signed)
-----   Message from [name], Generic sent at 10/20/2017 11:19 AM EST -----    Hi Dr. Talbert Nan,  At my last office visit you mentioned the option of a PT referral for my pelvic muscle pain. The pain intensifies when I run or do certain exercises. There are no other new symptoms to report. If you think I would benefit from PT, I would like a referral.  Thank you,  Julie Dorsey

## 2017-10-23 NOTE — Telephone Encounter (Signed)
Call returned to patient, left detailed message, ok per dpr. Advised Dr. Talbert Nan agreeable to referral to [facility] for PT, referral has been placed. Our office referral coordinator will f/u with scheduling. Return call to office with any additional questions.   Order placed for referral to PT, [facility].   Routing to [place] D.  Cc: Dr. Talbert Nan

## 2017-10-26 MED FILL — clonazePAM 0.5 MG TABS: 0.5 | 30 days supply | Qty: 30 | Fill #0

## 2017-11-08 DIAGNOSIS — M6281 Muscle weakness (generalized): Secondary | ICD-10-CM | POA: Diagnosis not present

## 2017-11-08 DIAGNOSIS — M62838 Other muscle spasm: Secondary | ICD-10-CM | POA: Diagnosis not present

## 2017-11-08 DIAGNOSIS — R102 Pelvic and perineal pain: Secondary | ICD-10-CM | POA: Diagnosis not present

## 2017-11-15 DIAGNOSIS — R102 Pelvic and perineal pain: Secondary | ICD-10-CM | POA: Diagnosis not present

## 2017-11-15 DIAGNOSIS — M6281 Muscle weakness (generalized): Secondary | ICD-10-CM | POA: Diagnosis not present

## 2017-11-15 DIAGNOSIS — M6200 Separation of muscle (nontraumatic), unspecified site: Secondary | ICD-10-CM | POA: Diagnosis not present

## 2017-11-15 DIAGNOSIS — M62838 Other muscle spasm: Secondary | ICD-10-CM | POA: Diagnosis not present

## 2017-11-21 DIAGNOSIS — M62838 Other muscle spasm: Secondary | ICD-10-CM | POA: Diagnosis not present

## 2017-11-21 DIAGNOSIS — M6281 Muscle weakness (generalized): Secondary | ICD-10-CM | POA: Diagnosis not present

## 2017-11-21 DIAGNOSIS — N941 Unspecified dyspareunia: Secondary | ICD-10-CM | POA: Diagnosis not present

## 2017-11-21 DIAGNOSIS — M6289 Other specified disorders of muscle: Secondary | ICD-10-CM | POA: Diagnosis not present

## 2017-11-21 DIAGNOSIS — R102 Pelvic and perineal pain: Secondary | ICD-10-CM | POA: Diagnosis not present

## 2017-11-28 DIAGNOSIS — M6200 Separation of muscle (nontraumatic), unspecified site: Secondary | ICD-10-CM | POA: Diagnosis not present

## 2017-11-28 DIAGNOSIS — M62838 Other muscle spasm: Secondary | ICD-10-CM | POA: Diagnosis not present

## 2017-11-28 DIAGNOSIS — M6281 Muscle weakness (generalized): Secondary | ICD-10-CM | POA: Diagnosis not present

## 2017-11-28 DIAGNOSIS — R102 Pelvic and perineal pain: Secondary | ICD-10-CM | POA: Diagnosis not present

## 2017-11-28 DIAGNOSIS — N941 Unspecified dyspareunia: Secondary | ICD-10-CM | POA: Diagnosis not present

## 2017-11-28 DIAGNOSIS — M6289 Other specified disorders of muscle: Secondary | ICD-10-CM | POA: Diagnosis not present

## 2017-12-05 DIAGNOSIS — M62838 Other muscle spasm: Secondary | ICD-10-CM | POA: Diagnosis not present

## 2017-12-05 DIAGNOSIS — N941 Unspecified dyspareunia: Secondary | ICD-10-CM | POA: Diagnosis not present

## 2017-12-05 DIAGNOSIS — R102 Pelvic and perineal pain: Secondary | ICD-10-CM | POA: Diagnosis not present

## 2017-12-05 DIAGNOSIS — M6281 Muscle weakness (generalized): Secondary | ICD-10-CM | POA: Diagnosis not present

## 2017-12-05 DIAGNOSIS — M6289 Other specified disorders of muscle: Secondary | ICD-10-CM | POA: Diagnosis not present

## 2017-12-13 DIAGNOSIS — M6281 Muscle weakness (generalized): Secondary | ICD-10-CM | POA: Diagnosis not present

## 2017-12-13 DIAGNOSIS — M6289 Other specified disorders of muscle: Secondary | ICD-10-CM | POA: Diagnosis not present

## 2017-12-13 DIAGNOSIS — N941 Unspecified dyspareunia: Secondary | ICD-10-CM | POA: Diagnosis not present

## 2017-12-13 DIAGNOSIS — R102 Pelvic and perineal pain: Secondary | ICD-10-CM | POA: Diagnosis not present

## 2017-12-13 DIAGNOSIS — M62838 Other muscle spasm: Secondary | ICD-10-CM | POA: Diagnosis not present

## 2017-12-21 DIAGNOSIS — M6289 Other specified disorders of muscle: Secondary | ICD-10-CM | POA: Diagnosis not present

## 2017-12-21 DIAGNOSIS — R102 Pelvic and perineal pain: Secondary | ICD-10-CM | POA: Diagnosis not present

## 2017-12-21 DIAGNOSIS — M6281 Muscle weakness (generalized): Secondary | ICD-10-CM | POA: Diagnosis not present

## 2017-12-21 DIAGNOSIS — N941 Unspecified dyspareunia: Secondary | ICD-10-CM | POA: Diagnosis not present

## 2017-12-21 DIAGNOSIS — M62838 Other muscle spasm: Secondary | ICD-10-CM | POA: Diagnosis not present

## 2017-12-26 DIAGNOSIS — R102 Pelvic and perineal pain: Secondary | ICD-10-CM | POA: Diagnosis not present

## 2017-12-26 DIAGNOSIS — N941 Unspecified dyspareunia: Secondary | ICD-10-CM | POA: Diagnosis not present

## 2017-12-26 DIAGNOSIS — M62838 Other muscle spasm: Secondary | ICD-10-CM | POA: Diagnosis not present

## 2017-12-26 DIAGNOSIS — M6281 Muscle weakness (generalized): Secondary | ICD-10-CM | POA: Diagnosis not present

## 2018-01-03 DIAGNOSIS — R102 Pelvic and perineal pain: Secondary | ICD-10-CM | POA: Diagnosis not present

## 2018-01-03 DIAGNOSIS — M6200 Separation of muscle (nontraumatic), unspecified site: Secondary | ICD-10-CM | POA: Diagnosis not present

## 2018-01-03 DIAGNOSIS — N941 Unspecified dyspareunia: Secondary | ICD-10-CM | POA: Diagnosis not present

## 2018-01-03 DIAGNOSIS — M62838 Other muscle spasm: Secondary | ICD-10-CM | POA: Diagnosis not present

## 2018-01-03 DIAGNOSIS — M6281 Muscle weakness (generalized): Secondary | ICD-10-CM | POA: Diagnosis not present

## 2018-01-03 MED FILL — clonazePAM 0.5 MG TABS: 0.5 | 30 days supply | Qty: 30 | Fill #1

## 2018-01-26 ENCOUNTER — Emergency Department (HOSPITAL_COMMUNITY)
Admission: EM | Admit: 2018-01-26 | Discharge: 2018-01-26 | Disposition: A | Discharge status: Home / Self Care | Payer: 59 | Attending: Emergency Medicine | Admitting: Emergency Medicine

## 2018-01-26 ENCOUNTER — Encounter (HOSPITAL_COMMUNITY): Payer: Self-pay

## 2018-01-26 ENCOUNTER — Other Ambulatory Visit: Payer: Self-pay

## 2018-01-26 DIAGNOSIS — Y929 Unspecified place or not applicable: Secondary | ICD-10-CM | POA: Insufficient documentation

## 2018-01-26 DIAGNOSIS — Y939 Activity, unspecified: Secondary | ICD-10-CM | POA: Insufficient documentation

## 2018-01-26 DIAGNOSIS — Y999 Unspecified external cause status: Secondary | ICD-10-CM | POA: Insufficient documentation

## 2018-01-26 DIAGNOSIS — M62838 Other muscle spasm: Secondary | ICD-10-CM | POA: Diagnosis not present

## 2018-01-26 DIAGNOSIS — Z79899 Other long term (current) drug therapy: Secondary | ICD-10-CM | POA: Diagnosis not present

## 2018-01-26 DIAGNOSIS — R079 Chest pain, unspecified: Secondary | ICD-10-CM | POA: Insufficient documentation

## 2018-01-26 DIAGNOSIS — M546 Pain in thoracic spine: Secondary | ICD-10-CM | POA: Diagnosis not present

## 2018-01-26 MED ORDER — IBUPROFEN 400 MG PO TABS: 400.0000 mg | ORAL_TABLET | Freq: Three times a day (TID) | ORAL | 0 refills | Status: AC

## 2018-01-26 MED ORDER — TIZANIDINE HCL 2 MG PO CAPS: 2.0000 mg | ORAL_CAPSULE | Freq: Three times a day (TID) | ORAL | 0 refills | Status: AC
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
## 2018-03-14 DIAGNOSIS — M6281 Muscle weakness (generalized): Secondary | ICD-10-CM | POA: Diagnosis not present

## 2018-03-14 DIAGNOSIS — M62838 Other muscle spasm: Secondary | ICD-10-CM | POA: Diagnosis not present

## 2018-03-14 DIAGNOSIS — R102 Pelvic and perineal pain: Secondary | ICD-10-CM | POA: Diagnosis not present

## 2018-03-14 DIAGNOSIS — M6289 Other specified disorders of muscle: Secondary | ICD-10-CM | POA: Diagnosis not present

## 2018-03-14 MED FILL — clonazePAM 0.5 MG TABS: 0.5 | 30 days supply | Qty: 30 | Fill #2

## 2018-06-05 ENCOUNTER — Other Ambulatory Visit: Payer: Self-pay | Admitting: Family Medicine

## 2018-06-05 DIAGNOSIS — Z1231 Encounter for screening mammogram for malignant neoplasm of breast: Secondary | ICD-10-CM

## 2018-06-14 MED FILL — clonazePAM 0.5 MG TABS: 0.5 | 30 days supply | Qty: 30 | Fill #0

## 2018-07-16 ENCOUNTER — Ambulatory Visit: Payer: 59

## 2018-07-24 ENCOUNTER — Ambulatory Visit
Admission: RE | Admit: 2018-07-24 | Discharge: 2018-07-24 | Disposition: A | Discharge status: Home / Self Care | Payer: 59 | Source: Ambulatory Visit | Attending: Family Medicine | Admitting: Family Medicine

## 2018-07-24 DIAGNOSIS — Z1231 Encounter for screening mammogram for malignant neoplasm of breast: Secondary | ICD-10-CM | POA: Diagnosis not present

## 2018-07-26 DIAGNOSIS — H52221 Regular astigmatism, right eye: Secondary | ICD-10-CM | POA: Diagnosis not present

## 2018-07-26 DIAGNOSIS — H3561 Retinal hemorrhage, right eye: Secondary | ICD-10-CM | POA: Diagnosis not present

## 2018-07-26 DIAGNOSIS — H5213 Myopia, bilateral: Secondary | ICD-10-CM | POA: Diagnosis not present

## 2018-08-06 MED FILL — CLINDAMYCIN HCL 150 MG CAPS: 150 | 7 days supply | Qty: 28 | Fill #0

## 2018-08-06 MED FILL — CHLORHEXIDINE 0.12% RINSE: 0.12 | 17 days supply | Qty: 473 | Fill #0

## 2018-08-28 MED FILL — AZITHROMYCIN 250 MG TABLET: 250 | 5 days supply | Qty: 6 | Fill #0

## 2018-09-06 MED FILL — clonazePAM 0.5 MG TABS: 0.5 | 30 days supply | Qty: 30 | Fill #1

## 2018-10-22 NOTE — Progress Notes (Signed)
53 y.o. [id number] Married White or Caucasian Not Hispanic or Latino female here for annual exam.   She saw PT last year for pelvic pain, diagnosed with diastasis. She is doing much better.  Still has some deep dyspareunia, no entry pain. It is better than it was.  No vaginal bleeding. No bowel or bladder issues.   She was diagnosed with a retinal hemorrhage in the last year. Etiology is unclear, she has a f/u appointment next week.     No LMP recorded. Patient is postmenopausal.          Sexually active: Yes.    The current method of family planning is post menopausal status.    Exercising: Yes.    running, tebata Smoker:  no  Health Maintenance: Pap:  07-08-15 WNL NEG HR HPV  History of abnormal Pap:  no MMG:  07/24/2018 Birads 1 negative Colonoscopy:  2016 polyp- repeat in 5 years  BMD:   Never TDaP:  Unsure, has this done with PCP Gardasil: N/A   reports that she has never smoked. She has never used smokeless tobacco. She reports previous alcohol use. She reports that she does not use drugs. 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 Son is a 4th year med Ship broker, married. Her daughter just finished college.   Past Medical History:  Diagnosis Date  . Hx of retinal hemorrhage    right eye    Past Surgical History:  Procedure Laterality Date  . DIGIT NAIL REMOVAL Right 04/2017   right toe nail removed  . WISDOM TOOTH EXTRACTION      Current Outpatient Medications  Medication Sig Dispense Refill  . albuterol (PROVENTIL HFA;VENTOLIN HFA) 108 (90 Base) MCG/ACT inhaler Inhale 2 puffs into the lungs as needed for wheezing or shortness of breath.    . Calcium Citrate-Vitamin D (CALCIUM + D PO) Take by mouth.    . clonazePAM (KLONOPIN) 0.5 MG tablet Take 0.5 mg by mouth 2 (two) times daily as needed for anxiety.    . Melatonin 10 MG CAPS Take 10 mg by mouth at bedtime.    . Multiple Vitamin (MULTIVITAMIN) capsule Take 1 capsule by mouth daily.     No current  facility-administered medications for this visit.     Family History  Problem Relation Age of Onset  . Breast cancer Mother 4  . Cancer Father 43       ureter cancer  . Heart disease Maternal Grandmother   . Heart disease Maternal Grandfather   . Heart disease Paternal Grandmother   . Heart disease Paternal Grandfather   Mom with breast cancer at 58, negative genetic testing. She is now 94 and doing well.   Review of Systems  Constitutional: Negative.   HENT: Negative.   Eyes: Negative.   Respiratory: Negative.   Cardiovascular: Negative.   Gastrointestinal: Negative.   Endocrine: Negative.   Genitourinary: Positive for dyspareunia.  Musculoskeletal: Negative.   Skin: Negative.   Allergic/Immunologic: Negative.   Neurological: Negative.   Hematological: Negative.   Psychiatric/Behavioral: Negative.     Exam:   BP 118/68 (BP Location: Right Arm, Patient Position: Sitting, Cuff Size: Normal)   Pulse 68   Ht 5' 5.35" (1.66 m)   Wt 141 lb 9.6 oz (64.2 kg)   BMI 23.31 kg/m   Weight change: @WEIGHTCHANGE @ Height:   Height: 5' 5.35" (166 cm)  Ht Readings from Last 3 Encounters:  10/24/18 5' 5.35" (1.66 m)  01/26/18 5\' 5"  (1.651  m)  08/31/17 5\' 5"  (1.651 m)    General appearance: alert, cooperative and appears stated age Head: Normocephalic, without obvious abnormality, atraumatic Neck: no adenopathy, supple, symmetrical, trachea midline and thyroid normal to inspection and palpation Lungs: clear to auscultation bilaterally Cardiovascular: regular rate and rhythm Breasts: normal appearance, no masses or tenderness Abdomen: soft, non-tender; non distended,  no masses,  no organomegaly. No significant diastasis noted.  Extremities: extremities normal, atraumatic, no cyanosis or edema Skin: Skin color, texture, turgor normal. No rashes or lesions Lymph nodes: Cervical, supraclavicular, and axillary nodes normal. No abnormal inguinal nodes palpated Neurologic: Grossly  normal   Pelvic: External genitalia:  no lesions              Urethra:  normal appearing urethra with no masses, tenderness or lesions              Bartholins and Skenes: normal                 Vagina: mildly atrophic appearing vagina with normal color and discharge, no lesions. Grade 1 cystocele and rectocele              Cervix: no lesions               Bimanual Exam:  Uterus:  normal size, contour, position, consistency, mobility, non-tender              Adnexa: no mass, fullness, tenderness               Rectovaginal: Confirms               Anus:  normal sphincter tone, no lesions  Chaperone was present for exam.  A:  Well Woman with normal exam  FH of breast cancer, negative genetic testing  P:   Pap with hpv  Mammogram UTD  Colonoscopy next year  Discussed breast self exam  Discussed calcium and vit D intake

## 2018-10-24 ENCOUNTER — Other Ambulatory Visit (HOSPITAL_COMMUNITY)
Admission: RE | Admit: 2018-10-24 | Discharge: 2018-10-24 | Disposition: A | Discharge status: Home / Self Care | Payer: 59 | Source: Ambulatory Visit | Attending: Obstetrics and Gynecology | Admitting: Obstetrics and Gynecology

## 2018-10-24 ENCOUNTER — Encounter: Payer: Self-pay | Admitting: Obstetrics and Gynecology

## 2018-10-24 ENCOUNTER — Other Ambulatory Visit: Payer: Self-pay

## 2018-10-24 ENCOUNTER — Ambulatory Visit (INDEPENDENT_AMBULATORY_CARE_PROVIDER_SITE_OTHER): Payer: 59 | Admitting: Obstetrics and Gynecology

## 2018-10-24 VITALS — BP 118/68 | HR 68 | Ht 65.35 in | Wt 141.6 lb

## 2018-10-24 DIAGNOSIS — Z124 Encounter for screening for malignant neoplasm of cervix: Secondary | ICD-10-CM

## 2018-10-24 DIAGNOSIS — Z0000 Encounter for general adult medical examination without abnormal findings: Secondary | ICD-10-CM | POA: Diagnosis not present

## 2018-10-24 DIAGNOSIS — Z01419 Encounter for gynecological examination (general) (routine) without abnormal findings: Secondary | ICD-10-CM | POA: Diagnosis not present

## 2018-10-24 NOTE — Patient Instructions (Signed)

## 2018-10-25 LAB — COMPREHENSIVE METABOLIC PANEL
ALT: 33 IU/L — ABNORMAL HIGH (ref 0–32)
AST: 35 IU/L (ref 0–40)
Albumin/Globulin Ratio: 2.5 — ABNORMAL HIGH (ref 1.2–2.2)
Albumin: 4.9 g/dL (ref 3.8–4.9)
Alkaline Phosphatase: 62 IU/L (ref 39–117)
BUN/Creatinine Ratio: 14 (ref 9–23)
BUN: 11 mg/dL (ref 6–24)
Bilirubin Total: 0.6 mg/dL (ref 0.0–1.2)
CO2: 24 mmol/L (ref 20–29)
Calcium: 9.6 mg/dL (ref 8.7–10.2)
Chloride: 102 mmol/L (ref 96–106)
Creatinine, Ser: 0.76 mg/dL (ref 0.57–1.00)
GFR calc Af Amer: 103 mL/min/{1.73_m2} (ref >59)
GFR calc non Af Amer: 89 mL/min/{1.73_m2} (ref >59)
Globulin, Total: 2 g/dL (ref 1.5–4.5)
Glucose: 82 mg/dL (ref 65–99)
Potassium: 4.6 mmol/L (ref 3.5–5.2)
Sodium: 142 mmol/L (ref 134–144)
Total Protein: 6.9 g/dL (ref 6.0–8.5)

## 2018-10-25 LAB — CBC
Hematocrit: 42.2 % (ref 34.0–46.6)
Hemoglobin: 13.6 g/dL (ref 11.1–15.9)
MCH: 31.4 pg (ref 26.6–33.0)
MCHC: 32.2 g/dL (ref 31.5–35.7)
MCV: 98 fL — ABNORMAL HIGH (ref 79–97)
Platelets: 278 10*3/uL (ref 150–450)
RBC: 4.33 x10E6/uL (ref 3.77–5.28)
RDW: 12.8 % (ref 11.7–15.4)
WBC: 4.4 10*3/uL (ref 3.4–10.8)

## 2018-10-25 LAB — LIPID PANEL
Chol/HDL Ratio: 2.5 ratio (ref 0.0–4.4)
Cholesterol, Total: 253 mg/dL — ABNORMAL HIGH (ref 100–199)
HDL: 100 mg/dL (ref >39)
LDL Calculated: 135 mg/dL — ABNORMAL HIGH (ref 0–99)
Triglycerides: 92 mg/dL (ref 0–149)
VLDL Cholesterol Cal: 18 mg/dL (ref 5–40)

## 2018-10-26 LAB — CYTOLOGY - PAP
Diagnosis: NEGATIVE
HPV: NOT DETECTED

## 2018-10-31 ENCOUNTER — Telehealth: Payer: Self-pay | Admitting: Obstetrics and Gynecology

## 2018-10-31 NOTE — Telephone Encounter (Signed)
Patient returning call. No open note.

## 2018-10-31 NOTE — Telephone Encounter (Signed)
Spoke with patient. Results given. Patient verbalizes understanding. 1 month lab recheck scheduled for 12/04/2018 at 8:30 am. Patient is agreeable to date and time. 02 recall entered. 11/06/2019 next aex. Encounter closed.  Notes recorded by Gwendlyn Deutscher, RN on 10/30/2018 at 4:56 PM EDT Left message to call Zadyn Yardley at [phone]. ------  Notes recorded by Salvadore Dom, MD on 10/28/2018 at 1:44 PM EDT Please let the patient know that one of her LFT's is just above normal. I would recommend she limit her ETOH use and even OTC meds (ie tylenol). She should return for a liver panel in a month.  Her LDL cholesterol is mildly elevated, but the rest of her lipid panel is great (nothing to worry about) The rest of her labs are fine 02 recall

## 2018-11-01 DIAGNOSIS — H3561 Retinal hemorrhage, right eye: Secondary | ICD-10-CM | POA: Diagnosis not present

## 2018-11-05 DIAGNOSIS — J45990 Exercise induced bronchospasm: Secondary | ICD-10-CM | POA: Diagnosis not present

## 2018-11-05 DIAGNOSIS — E782 Mixed hyperlipidemia: Secondary | ICD-10-CM | POA: Diagnosis not present

## 2018-11-05 DIAGNOSIS — Z23 Encounter for immunization: Secondary | ICD-10-CM | POA: Diagnosis not present

## 2018-11-05 DIAGNOSIS — Z5181 Encounter for therapeutic drug level monitoring: Secondary | ICD-10-CM | POA: Diagnosis not present

## 2018-11-05 DIAGNOSIS — Z0000 Encounter for general adult medical examination without abnormal findings: Secondary | ICD-10-CM | POA: Diagnosis not present

## 2018-11-05 MED FILL — ZOLPIDEM TARTRATE 10 MG TAB: 10 | 30 days supply | Qty: 30 | Fill #0

## 2018-11-21 ENCOUNTER — Telehealth: Payer: Self-pay | Admitting: Obstetrics and Gynecology

## 2018-11-21 NOTE — Telephone Encounter (Signed)
Forwarding this to Dr. Talbert Nan, the patient's primary GYN.   Cc - Dr. Talbert Nan

## 2018-11-21 NOTE — Telephone Encounter (Signed)
Called and spoke with patient to move her lab appointment for a liver panel on 12/04/18 up to later in the day due to schedule changes. The patient declined to reschedule until 02/26/19 at 8:30am due to coronavirus concerns.  Routing to provider for review.

## 2018-11-21 NOTE — Telephone Encounter (Signed)
That's fine, will close the encounter.

## 2018-12-04 ENCOUNTER — Other Ambulatory Visit: Payer: 59

## 2019-02-26 ENCOUNTER — Other Ambulatory Visit: Payer: 59

## 2019-03-21 MED FILL — ZOLPIDEM TARTRATE 10 MG TAB: 10 | 30 days supply | Qty: 30 | Fill #1

## 2019-05-29 MED FILL — ZOLPIDEM TARTRATE 10 MG TAB: 10 | 30 days supply | Qty: 30 | Fill #0

## 2019-06-24 ENCOUNTER — Other Ambulatory Visit: Payer: Self-pay | Admitting: Family Medicine

## 2019-06-24 DIAGNOSIS — Z1231 Encounter for screening mammogram for malignant neoplasm of breast: Secondary | ICD-10-CM

## 2019-06-25 MED FILL — ZOLPIDEM TARTRATE 10 MG TAB: 10 | 30 days supply | Qty: 30 | Fill #0

## 2019-08-08 MED FILL — ZOLPIDEM TARTRATE 10 MG TAB: 10 | 30 days supply | Qty: 30 | Fill #1

## 2019-08-26 ENCOUNTER — Ambulatory Visit
Admission: EM | Admit: 2019-08-26 | Discharge: 2019-08-26 | Disposition: A | Discharge status: Home / Self Care | Payer: 59

## 2019-08-26 DIAGNOSIS — S61315A Laceration without foreign body of left ring finger with damage to nail, initial encounter: Secondary | ICD-10-CM

## 2019-08-26 DIAGNOSIS — W260XXA Contact with knife, initial encounter: Secondary | ICD-10-CM

## 2019-08-26 NOTE — ED Triage Notes (Signed)
Pt states cut her lt ring finger with a knife. Lac noted, bleeding controlled

## 2019-08-26 NOTE — Discharge Instructions (Signed)
2sutures placed. You can remove current dressing in 24 hours. Keep wound clean and dry. You can clean gently with soap and water. Do not soak area in water. Try to avoid further injury to the nail, trim it as needed while nail is growing. Monitor for spreading redness, increased warmth, increased swelling, fever, follow up for reevaluation needed. Otherwise follow up in 7 days for suture removal.

## 2019-08-26 NOTE — ED Provider Notes (Signed)
[name]    CSN: [id number] Arrival date & time: 08/26/19  1745      History   Chief Complaint Chief Complaint  Patient presents with  . Finger Injury    HPI Julie Dorsey is a 54 y.o. female.   54 year old female comes in for left right finger laceration few hours prior to arrival. Patient was using a new kitchen knife when sustaining the laceration. Bleeding controlled with pressure. Denies any numbness/tingling. Denies trouble moving the finger. Tetanus up to date.      Past Medical History:  Diagnosis Date  . Hx of retinal hemorrhage    right eye    There are no problems to display for this patient.   Past Surgical History:  Procedure Laterality Date  . DIGIT NAIL REMOVAL Right 04/2017   right toe nail removed  . WISDOM TOOTH EXTRACTION      OB History    Gravida  5   Para  2   Term  2   Preterm      AB  3   Living  2     SAB  3   TAB      Ectopic      Multiple      Live Births  2            Home Medications    Prior to Admission medications   Medication Sig Start Date End Date Taking? Authorizing Provider  zolpidem (AMBIEN) 5 MG tablet Take 5 mg by mouth at bedtime as needed for sleep.   Yes [provider]  albuterol (PROVENTIL HFA;VENTOLIN HFA) 108 (90 Base) MCG/ACT inhaler Inhale 2 puffs into the lungs as needed for wheezing or shortness of breath.    [provider]  Calcium Citrate-Vitamin D (CALCIUM + D PO) Take by mouth.    [provider]  Melatonin 10 MG CAPS Take 10 mg by mouth at bedtime.    [provider]  Multiple Vitamin (MULTIVITAMIN) capsule Take 1 capsule by mouth daily.    [provider]    Family History Family History  Problem Relation Age of Onset  . Breast cancer Mother 52  . Cancer Father 64       ureter cancer  . Heart disease Maternal Grandmother   . Heart disease Maternal Grandfather   . Heart disease Paternal Grandmother   . Heart  disease Paternal Grandfather     Social History Social History   Tobacco Use  . Smoking status: Never Smoker  . Smokeless tobacco: Never Used  Substance Use Topics  . Alcohol use: Not Currently  . Drug use: No     Allergies   Penicillins   Review of Systems Review of Systems  Reason unable to perform ROS: See HPI as above.     Physical Exam Triage Vital Signs ED Triage Vitals [08/26/19 1759]  Enc Vitals Group     BP (!) 147/83     Pulse Rate (!) 48     Resp 16     Temp 97.9 F (36.6 C)     Temp Source Oral     SpO2 98 %     Weight      Height      Head Circumference      Peak Flow      Pain Score      Pain Loc      Pain Edu?      Excl. in [place]?  No data found.  Updated Vital Signs BP (!) 147/83 (BP Location: Right Arm)   Pulse (!) 48   Temp 97.9 F (36.6 C) (Oral)   Resp 16   SpO2 98%   Visual Acuity Right Eye Distance:   Left Eye Distance:   Bilateral Distance:    Right Eye Near:   Left Eye Near:    Bilateral Near:     Physical Exam Constitutional:      General: She is not in acute distress.    Appearance: She is well-developed. She is not diaphoretic.  HENT:     Head: Normocephalic and atraumatic.  Eyes:     Conjunctiva/sclera: Conjunctivae normal.     Pupils: Pupils are equal, round, and reactive to light.  Pulmonary:     Effort: Pulmonary effort is normal. No respiratory distress.  Musculoskeletal:     Comments: Left ring finger laceration to the distal finger with nailbed involvement. Bleeding controlled with pressure. No obvious bone exposure. Full ROM of finger. NVI.  Skin:    General: Skin is warm and dry.  Neurological:     Mental Status: She is alert and oriented to person, place, and time.      UC Treatments / Results  Labs (all labs ordered are listed, but only abnormal results are displayed) Labs Reviewed - No data to display  EKG   Radiology No results found.  Procedures Laceration Repair  Date/Time:  08/26/2019 10:13 PM Performed by: Ok Edwards, PA-C Authorized by: Ok Edwards, PA-C   Consent:    Consent obtained:  Verbal   Consent given by:  Patient   Risks discussed:  Infection, pain, retained foreign body, need for additional repair, poor cosmetic result and poor wound healing Anesthesia (see MAR for exact dosages):    Anesthesia method:  Nerve block   Block location:  Left ring finger.   Block needle gauge:  27 G   Block anesthetic:  Lidocaine 2% w/o epi   Block injection procedure:  Anatomic landmarks identified, introduced needle, incremental injection, anatomic landmarks palpated and negative aspiration for blood   Block outcome:  Anesthesia achieved Laceration details:    Location:  Finger   Finger location:  L ring finger   Length (cm):  2   Depth (mm):  2 Repair type:    Repair type:  Simple Pre-procedure details:    Preparation:  Patient was prepped and draped in usual sterile fashion Exploration:    Hemostasis achieved with:  Direct pressure   Wound exploration: wound explored through full range of motion and entire depth of wound probed and visualized     Contaminated: no   Treatment:    Area cleansed with:  Hibiclens   Amount of cleaning:  Standard   Irrigation solution:  Sterile water   Irrigation method:  Pressure wash   Visualized foreign bodies/material removed: no   Skin repair:    Repair method:  Sutures   Suture size:  6-0   Suture material:  Prolene   Suture technique:  Simple interrupted   Number of sutures:  2 Approximation:    Approximation:  Close Post-procedure details:    Dressing:  Antibiotic ointment and bulky dressing   Patient tolerance of procedure:  Tolerated well, no immediate complications   (including critical care time)  Medications Ordered in UC Medications - No data to display  Initial Impression / Assessment and Plan / UC Course  I have reviewed the triage vital signs and the nursing  notes.  Pertinent labs & imaging results  that were available during my care of the patient were reviewed by me and considered in my medical decision making (see chart for details).    Discussed case with Dr Lanny Cramp. Laceration to the finger extending to the nail damage. No obvious nailbed laceration. After discussing laceration, Dr Lanny Cramp suggested no need for nail removal with repair at this time. Sutures placed to the finger lateral of nail. Wound care instructions given. Return precautions given. Otherwise, follow up for suture removal in 7 days. Patient expresses understanding and agrees to plan.  Final Clinical Impressions(s) / UC Diagnoses   Final diagnoses:  Laceration of left ring finger without foreign body with damage to nail, initial encounter    ED Prescriptions    None     PDMP not reviewed this encounter.   Ok Edwards, PA-C 08/26/19 2219

## 2019-09-03 ENCOUNTER — Ambulatory Visit
Admission: EM | Admit: 2019-09-03 | Discharge: 2019-09-03 | Disposition: A | Discharge status: Home / Self Care | Payer: 59

## 2019-09-03 ENCOUNTER — Encounter: Payer: Self-pay | Admitting: Emergency Medicine

## 2019-09-03 ENCOUNTER — Other Ambulatory Visit: Payer: Self-pay

## 2019-09-03 ENCOUNTER — Ambulatory Visit
Admission: RE | Admit: 2019-09-03 | Discharge: 2019-09-03 | Disposition: A | Discharge status: Home / Self Care | Payer: 59 | Source: Ambulatory Visit | Attending: Family Medicine | Admitting: Family Medicine

## 2019-09-03 DIAGNOSIS — Z1231 Encounter for screening mammogram for malignant neoplasm of breast: Secondary | ICD-10-CM | POA: Diagnosis not present

## 2019-09-03 DIAGNOSIS — Z4802 Encounter for removal of sutures: Secondary | ICD-10-CM

## 2019-09-03 NOTE — ED Notes (Signed)
Patient able to ambulate independently  

## 2019-09-03 NOTE — ED Triage Notes (Signed)
Pt presents to [facility] for suture removal of 2 sutures to the ring finger of her left hand.  Area is healing well, edges are approximated, no warmth, redness, or exudate.

## 2019-09-10 DIAGNOSIS — Z86010 Personal history of colonic polyps: Secondary | ICD-10-CM | POA: Diagnosis not present

## 2019-09-10 DIAGNOSIS — K625 Hemorrhage of anus and rectum: Secondary | ICD-10-CM | POA: Diagnosis not present

## 2019-10-10 DIAGNOSIS — Z1159 Encounter for screening for other viral diseases: Secondary | ICD-10-CM | POA: Diagnosis not present

## 2019-10-15 DIAGNOSIS — K625 Hemorrhage of anus and rectum: Secondary | ICD-10-CM | POA: Diagnosis not present

## 2019-10-15 LAB — HM COLONOSCOPY

## 2019-11-06 ENCOUNTER — Other Ambulatory Visit: Payer: Self-pay

## 2019-11-06 ENCOUNTER — Encounter: Payer: Self-pay | Admitting: Obstetrics and Gynecology

## 2019-11-06 ENCOUNTER — Ambulatory Visit: Payer: 59 | Admitting: Obstetrics and Gynecology

## 2019-11-06 VITALS — BP 118/68 | HR 77 | Temp 98.4°F | Ht 65.0 in | Wt 142.0 lb

## 2019-11-06 DIAGNOSIS — R1030 Lower abdominal pain, unspecified: Secondary | ICD-10-CM | POA: Diagnosis not present

## 2019-11-06 DIAGNOSIS — Z01419 Encounter for gynecological examination (general) (routine) without abnormal findings: Secondary | ICD-10-CM | POA: Diagnosis not present

## 2019-11-06 DIAGNOSIS — K625 Hemorrhage of anus and rectum: Secondary | ICD-10-CM

## 2019-11-06 NOTE — Patient Instructions (Signed)

## 2019-11-06 NOTE — Progress Notes (Signed)
54 y.o. [id number] Married White or Caucasian Not Hispanic or Latino female here for annual exam.  No vaginal bleeding, no dyspareunia. Bladder function is good. Pelvic pain has improved with PT as did her rectus diastasis.    Patient states that her pelvic floor dysfunction is better but she still can not run a lot.   In January she had rectal bleeding x 1 after running and it happened again x 1 in February. Colonoscopy was normal in 2/21. Told her it was a rare issue with runners, perfusion issue. She does get some abdominal pain after long runs.     No LMP recorded. Patient is postmenopausal.          Sexually active: Yes.    The current method of family planning is post menopausal status.    Exercising: Yes.    running  Smoker:  no  Health Maintenance: Pap:10/24/18 normal Neg HR HPV  07/08/15 normal Neg Hr HPV  History of abnormal Pap:  no MMG:  09/03/19 Density B Bi-rads 1 neg  BMD:   Never  Colonoscopy: 10/15/19 normal repeat in 5 years TDaP:  2020 Gardasil: NA   reports that she has never smoked. She has never used smokeless tobacco. She reports previous alcohol use. She reports that she does not use drugs. Rare ETOH. 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 Son graduated from med school last year.   Past Medical History:  Diagnosis Date  . Hx of retinal hemorrhage    right eye    Past Surgical History:  Procedure Laterality Date  . DIGIT NAIL REMOVAL Right 04/2017   right toe nail removed  . WISDOM TOOTH EXTRACTION      Current Outpatient Medications  Medication Sig Dispense Refill  . Calcium Citrate-Vitamin D (CALCIUM + D PO) Take by mouth.    . Melatonin 10 MG CAPS Take 10 mg by mouth at bedtime.    . Multiple Vitamin (MULTIVITAMIN) capsule Take 1 capsule by mouth daily.    Marland Kitchen zolpidem (AMBIEN) 5 MG tablet Take 5 mg by mouth at bedtime as needed for sleep.     No current facility-administered medications for this visit.    Family History  Problem  Relation Age of Onset  . Breast cancer Mother 36  . Cancer Father 41       ureter cancer  . Heart disease Maternal Grandmother   . Heart disease Maternal Grandfather   . Heart disease Paternal Grandmother   . Heart disease Paternal Grandfather   Mom with negative genetic testing, now 45 and doing well.  Dad died at 76, 60 years ago.   Review of Systems  Constitutional: Negative.   HENT: Negative.   Eyes: Negative.   Respiratory: Negative.   Cardiovascular: Negative.   Gastrointestinal: Negative.   Endocrine: Negative.   Genitourinary: Negative.   Musculoskeletal: Negative.   Allergic/Immunologic: Negative.   Neurological: Negative.   Hematological: Negative.   Psychiatric/Behavioral: Negative.   All other systems reviewed and are negative.   Exam:   BP 118/68   Pulse 77   Temp 98.4 F (36.9 C)   Ht 5\' 5"  (1.651 m)   Wt 142 lb (64.4 kg)   SpO2 95%   BMI 23.63 kg/m   Weight change: @WEIGHTCHANGE @ Height:   Height: 5\' 5"  (165.1 cm)  Ht Readings from Last 3 Encounters:  11/06/19 5\' 5"  (1.651 m)  10/24/18 5' 5.35" (1.66 m)  01/26/18 5\' 5"  (1.651 m)  General appearance: alert, cooperative and appears stated age Head: Normocephalic, without obvious abnormality, atraumatic Neck: no adenopathy, supple, symmetrical, trachea midline and thyroid normal to inspection and palpation Lungs: clear to auscultation bilaterally Cardiovascular: regular rate and rhythm Breasts: normal appearance, no masses or tenderness Abdomen: soft, non-tender; non distended,  no masses,  no organomegaly Extremities: extremities normal, atraumatic, no cyanosis or edema Skin: Skin color, texture, turgor normal. No rashes or lesions Lymph nodes: Cervical, supraclavicular, and axillary nodes normal. No abnormal inguinal nodes palpated Neurologic: Grossly normal   Pelvic: External genitalia:  no lesions              Urethra:  normal appearing urethra with no masses, tenderness or lesions               Bartholins and Skenes: normal                 Vagina: normal appearing vagina with normal color and discharge, no lesions              Cervix: no lesions               Bimanual Exam:  Uterus:  normal size, contour, position, consistency, mobility, non-tender              Adnexa: no mass, fullness, tenderness               Rectovaginal: Confirms               Anus:  normal sphincter tone, no lesions  Dorethea Clan chaperoned for the exam.  A:  Well Woman with normal exam  She has had issues with perfusion of her gut and ? Her eye with long distance running.  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 She is not anemic. CV disease is a risk factor. Will advise her to f/u with her primary.  P:   No pap this year  Mammogram and colonoscopy UTD  Discussed breast self exam  Discussed calcium and vit D intake  Labs with primary  She needs to stay well hydrated prior to, during and after exercise to maintain a normal blood pressure for intestinal perfusion.   CC: Maurice Small, MD

## 2019-12-09 MED FILL — ZOLPIDEM TARTRATE 5 MG TAB: 5 | 60 days supply | Qty: 30 | Fill #0

## 2019-12-25 ENCOUNTER — Other Ambulatory Visit: Payer: Self-pay

## 2019-12-25 ENCOUNTER — Encounter: Payer: Self-pay | Admitting: Physician Assistant

## 2019-12-25 ENCOUNTER — Ambulatory Visit (INDEPENDENT_AMBULATORY_CARE_PROVIDER_SITE_OTHER): Payer: 59 | Admitting: Physician Assistant

## 2019-12-25 DIAGNOSIS — L309 Dermatitis, unspecified: Secondary | ICD-10-CM | POA: Diagnosis not present

## 2019-12-25 DIAGNOSIS — Z1283 Encounter for screening for malignant neoplasm of skin: Secondary | ICD-10-CM | POA: Diagnosis not present

## 2019-12-25 DIAGNOSIS — L84 Corns and callosities: Secondary | ICD-10-CM

## 2019-12-25 MED ORDER — BETAMETHASONE DIPROPIONATE 0.05 % EX LOTN: TOPICAL_LOTION | Freq: Every day | CUTANEOUS | 1 refills | Status: DC

## 2019-12-25 MED FILL — BETAMETHASONE DP 0.05% LOT: 0.05 | 30 days supply | Qty: 60 | Fill #0

## 2019-12-25 NOTE — Progress Notes (Signed)
   New Patient Visit  Subjective  Julie Dorsey is a 54 y.o. female who presents for the following: Annual Exam ([facility] patient ). She used to see Crista Luria. She has no history of skin cancer. Has had a mole removed from her toe in the past. Unsure of exact pathology. She has spot on the bottom of her left foot. During the winter it hurt and now its better.   Objective  Well appearing patient in no apparent distress; mood and affect are within normal limits.  A full examination was performed including head, eyes, ears, nose, lips, neck, chest, axillae, abdomen, back, buttocks, bilateral upper extremities, bilateral lower extremities, hands, feet, fingers, toes, fingernails, and toenails. All findings within normal limits unless otherwise noted below. No suspicious moles noted on back.  Objective  Left 2nd Metatarsal Plantar Area, Right 2nd Metatarsal Plantar Area: Callus noted.  Objective  Mid Frontal Scalp: Ill-defined pink papules/plaques with scale-crust.   Assessment & Plan  Corns and callosities (2) Left 2nd Metatarsal Plantar Area; Right 2nd Metatarsal Plantar Area  OTC treatment.  Dermatitis Mid Frontal Scalp  Ordered Medications: betamethasone dipropionate 0.05 % lotion  Total body skin exam

## 2019-12-25 NOTE — Progress Notes (Signed)
Mel nevus on toe patient don't remember what toe. Tanning h/o 1 time I high school

## 2020-01-30 DIAGNOSIS — J45990 Exercise induced bronchospasm: Secondary | ICD-10-CM | POA: Diagnosis not present

## 2020-01-30 DIAGNOSIS — Z0000 Encounter for general adult medical examination without abnormal findings: Secondary | ICD-10-CM | POA: Diagnosis not present

## 2020-01-30 DIAGNOSIS — E782 Mixed hyperlipidemia: Secondary | ICD-10-CM | POA: Diagnosis not present

## 2020-01-30 DIAGNOSIS — Z5181 Encounter for therapeutic drug level monitoring: Secondary | ICD-10-CM | POA: Diagnosis not present

## 2020-02-12 ENCOUNTER — Other Ambulatory Visit: Payer: Self-pay | Admitting: Family Medicine

## 2020-02-12 DIAGNOSIS — R1030 Lower abdominal pain, unspecified: Secondary | ICD-10-CM

## 2020-03-02 ENCOUNTER — Ambulatory Visit
Admission: RE | Admit: 2020-03-02 | Discharge: 2020-03-02 | Disposition: A | Discharge status: Home / Self Care | Payer: 59 | Source: Ambulatory Visit | Attending: Family Medicine | Admitting: Family Medicine

## 2020-03-02 DIAGNOSIS — R1030 Lower abdominal pain, unspecified: Secondary | ICD-10-CM

## 2020-03-02 DIAGNOSIS — N838 Other noninflammatory disorders of ovary, fallopian tube and broad ligament: Secondary | ICD-10-CM | POA: Diagnosis not present

## 2020-03-02 MED ORDER — IOPAMIDOL (ISOVUE-370) INJECTION 76%
75.0000 mL | Freq: Once | INTRAVENOUS | Status: AC | PRN
  Administered 2020-03-02: 75 mL via INTRAVENOUS

## 2020-03-24 MED FILL — ZOLPIDEM TARTRATE 10 MG TAB: 10 | 30 days supply | Qty: 30 | Fill #0

## 2020-05-25 MED FILL — ZOLPIDEM TARTRATE 10 MG TAB: 10 | 30 days supply | Qty: 30 | Fill #1

## 2020-09-11 ENCOUNTER — Other Ambulatory Visit: Payer: Self-pay | Admitting: Obstetrics and Gynecology

## 2020-09-11 DIAGNOSIS — Z1231 Encounter for screening mammogram for malignant neoplasm of breast: Secondary | ICD-10-CM

## 2020-10-19 ENCOUNTER — Other Ambulatory Visit: Payer: Self-pay | Admitting: Obstetrics and Gynecology

## 2020-10-19 DIAGNOSIS — Z1231 Encounter for screening mammogram for malignant neoplasm of breast: Secondary | ICD-10-CM

## 2020-11-16 NOTE — Progress Notes (Unsigned)
55 y.o. [id number] Married White or Caucasian Not Hispanic or Latino female here for annual exam.   No vaginal bleeding. No dyspareunia.   Prior pelvic pain is better. PT did help.   No bowel or bladder c/o.     No LMP recorded. Patient is postmenopausal.          Sexually active: Yes.    The current method of family planning is post menopausal status.    Exercising: Yes.    running and strength training  Smoker:  no  Health Maintenance: Pap:  :10/24/18 normal Neg HR HPV  07/08/15 normal Neg Hr HPV  History of abnormal Pap:  no MMG:  1/12/21density B Bi-rads 1 neg, scheduled next week.  BMD:   None  Colonoscopy: 10/15/19 normal repeat in 5 years TDaP:  2020 Gardasil: NA   reports that she has never smoked. She has never used smokeless tobacco. She reports previous alcohol use. She reports that she does not use drugs. 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 Son is doing his internal medicine residency. Daughter is 43 works in a Rec[employer], getting her masters.   Past Medical History:  Diagnosis Date  . Hx of retinal hemorrhage    right eye    Past Surgical History:  Procedure Laterality Date  . DIGIT NAIL REMOVAL Right 04/2017   right toe nail removed  . WISDOM TOOTH EXTRACTION      Current Outpatient Medications  Medication Sig Dispense Refill  . Calcium Citrate-Vitamin D (CALCIUM + D PO) Take by mouth.    . Melatonin 10 MG CAPS Take 10 mg by mouth at bedtime.    . Multiple Vitamin (MULTIVITAMIN) capsule Take 1 capsule by mouth daily.    Marland Kitchen zolpidem (AMBIEN) 5 MG tablet Take 5 mg by mouth at bedtime as needed for sleep.     No current facility-administered medications for this visit.    Family History  Problem Relation Age of Onset  . Breast cancer Mother 5  . Cancer Father 66       ureter cancer  . Heart disease Maternal Grandmother   . Heart disease Maternal Grandfather   . Heart disease Paternal Grandmother   . Heart  disease Paternal Grandfather   Mom was 41 at diagnosis of breast cancer, negative genetic testing.   Review of Systems  All other systems reviewed and are negative.   Exam:   BP 100/64   Pulse (!) 55   Ht 5' 5.5" (1.664 m)   Wt 143 lb (64.9 kg)   SpO2 100%   BMI 23.43 kg/m   Weight change: @WEIGHTCHANGE @ Height:   Height: 5' 5.5" (166.4 cm)  Ht Readings from Last 3 Encounters:  11/18/20 5' 5.5" (1.664 m)  11/06/19 5\' 5"  (1.651 m)  10/24/18 5' 5.35" (1.66 m)    General appearance: alert, cooperative and appears stated age Head: Normocephalic, without obvious abnormality, atraumatic Neck: no adenopathy, supple, symmetrical, trachea midline and thyroid normal to inspection and palpation Lungs: clear to auscultation bilaterally Cardiovascular: regular rate and rhythm Breasts: normal appearance, no masses or tenderness Abdomen: soft, non-tender; non distended,  no masses,  no organomegaly Extremities: extremities normal, atraumatic, no cyanosis or edema Skin: Skin color, texture, turgor normal. No rashes or lesions Lymph nodes: Cervical, supraclavicular, and axillary nodes normal. No abnormal inguinal nodes palpated Neurologic: Grossly normal   Pelvic: External genitalia:  no lesions  Urethra:  normal appearing urethra with no masses, tenderness or lesions              Bartholins and Skenes: normal                 Vagina: mildly atrophic appearing vagina with normal color and discharge, no lesions, grade 1 cystocele, grade 1-2 rectocele              Cervix: no lesions               Bimanual Exam:  Uterus:  normal size, contour, position, consistency, mobility, non-tender              Adnexa: no mass, fullness, tenderness               Rectovaginal: Confirms               Anus:  normal sphincter tone, no lesions  Wandra Scot chaperoned for the exam.   1. Well woman exam Discussed breast self exam Discussed calcium and vit D intake Labs with primary Mammogram  next week Colonoscopy is UTD No pap this year  2. Rectocele Not symptomatic Discussed avoiding heavy lifting and straining  3. Midline cystocele Not symptomatic Discussed avoiding heavy lifting and straining

## 2020-11-18 ENCOUNTER — Ambulatory Visit (INDEPENDENT_AMBULATORY_CARE_PROVIDER_SITE_OTHER): Payer: 59 | Admitting: Obstetrics and Gynecology

## 2020-11-18 ENCOUNTER — Encounter: Payer: Self-pay | Admitting: Obstetrics and Gynecology

## 2020-11-18 ENCOUNTER — Other Ambulatory Visit: Payer: Self-pay

## 2020-11-18 VITALS — BP 100/64 | HR 55 | Ht 65.5 in | Wt 143.0 lb

## 2020-11-18 DIAGNOSIS — G4700 Insomnia, unspecified: Secondary | ICD-10-CM | POA: Insufficient documentation

## 2020-11-18 DIAGNOSIS — J45990 Exercise induced bronchospasm: Secondary | ICD-10-CM | POA: Insufficient documentation

## 2020-11-18 DIAGNOSIS — N9489 Other specified conditions associated with female genital organs and menstrual cycle: Secondary | ICD-10-CM | POA: Insufficient documentation

## 2020-11-18 DIAGNOSIS — Z860100 Personal history of colon polyps, unspecified: Secondary | ICD-10-CM | POA: Insufficient documentation

## 2020-11-18 DIAGNOSIS — N816 Rectocele: Secondary | ICD-10-CM | POA: Diagnosis not present

## 2020-11-18 DIAGNOSIS — K625 Hemorrhage of anus and rectum: Secondary | ICD-10-CM | POA: Insufficient documentation

## 2020-11-18 DIAGNOSIS — Z01419 Encounter for gynecological examination (general) (routine) without abnormal findings: Secondary | ICD-10-CM

## 2020-11-18 DIAGNOSIS — N8111 Cystocele, midline: Secondary | ICD-10-CM | POA: Diagnosis not present

## 2020-11-18 DIAGNOSIS — R739 Hyperglycemia, unspecified: Secondary | ICD-10-CM | POA: Insufficient documentation

## 2020-11-18 DIAGNOSIS — E782 Mixed hyperlipidemia: Secondary | ICD-10-CM | POA: Insufficient documentation

## 2020-11-18 NOTE — Patient Instructions (Signed)
EXERCISE   We recommended that you start or continue a regular exercise program for good health. Physical activity is anything that gets your body moving, some is better than none. The CDC recommends 150 minutes per week of Moderate-Intensity Aerobic Activity and 2 or more days of Muscle Strengthening Activity.  Benefits of exercise are limitless: helps weight loss/weight maintenance, improves mood and energy, helps with depression and anxiety, improves sleep, tones and strengthens muscles, improves balance, improves bone density, protects from chronic conditions such as heart disease, high blood pressure and diabetes and so much more. To learn more visit: https://www.cdc.gov/physicalactivity/index.html  DIET: Good nutrition starts with a healthy diet of fruits, vegetables, whole grains, and lean protein sources. Drink plenty of water for hydration. Minimize empty calories, sodium, sweets. For more information about dietary recommendations visit: https://health.gov/our-work/nutrition-physical-activity/dietary-guidelines and https://www.myplate.gov/  ALCOHOL:  Women should limit their alcohol intake to no more than 7 drinks/beers/glasses of wine (combined, not each!) per week. Moderation of alcohol intake to this level decreases your risk of breast cancer and liver damage.  If you are concerned that you may have a problem, or your friends have told you they are concerned about your drinking, there are many resources to help. A well-known program that is free, effective, and available to all people all over the nation is Alcoholics Anonymous.  Check out this site to learn more: https://www.aa.org/   CALCIUM AND VITAMIN D:  Adequate intake of calcium and Vitamin D are recommended for bone health.  You should be getting between 1000-1200 mg of calcium and 800 units of Vitamin D daily between diet and supplements  PAP SMEARS:  Pap smears, to check for cervical cancer or precancers,  have traditionally been  done yearly, scientific advances have shown that most women can have pap smears less often.  However, every woman still should have a physical exam from her gynecologist every year. It will include a breast check, inspection of the vulva and vagina to check for abnormal growths or skin changes, a visual exam of the cervix, and then an exam to evaluate the size and shape of the uterus and ovaries. We will also provide age appropriate advice regarding health maintenance, like when you should have certain vaccines, screening for sexually transmitted diseases, bone density testing, colonoscopy, mammograms, etc.   MAMMOGRAMS:  All women over 40 years old should have a routine mammogram.   COLON CANCER SCREENING: Now recommend starting at age 45. At this time colonoscopy is not covered for routine screening until 50. There are take home tests that can be done between 45-49.   COLONOSCOPY:  Colonoscopy to screen for colon cancer is recommended for all women at age 50.  We know, you hate the idea of the prep.  We agree, BUT, having colon cancer and not knowing it is worse!!  Colon cancer so often starts as a polyp that can be seen and removed at colonscopy, which can quite literally save your life!  And if your first colonoscopy is normal and you have no family history of colon cancer, most women don't have to have it again for 10 years.  Once every ten years, you can do something that may end up saving your life, right?  We will be happy to help you get it scheduled when you are ready.  Be sure to check your insurance coverage so you understand how much it will cost.  It may be covered as a preventative service at no cost, but you should check   your particular policy.      Breast Self-Awareness Breast self-awareness means being familiar with how your breasts look and feel. It involves checking your breasts regularly and reporting any changes to your health care provider. Practicing breast self-awareness is  important. A change in your breasts can be a sign of a serious medical problem. Being familiar with how your breasts look and feel allows you to find any problems early, when treatment is more likely to be successful. All women should practice breast self-awareness, including women who have had breast implants. How to do a breast self-exam One way to learn what is normal for your breasts and whether your breasts are changing is to do a breast self-exam. To do a breast self-exam: Look for Changes  1. Remove all the clothing above your waist. 2. Stand in front of a mirror in a room with good lighting. 3. Put your hands on your hips. 4. Push your hands firmly downward. 5. Compare your breasts in the mirror. Look for differences between them (asymmetry), such as: ? Differences in shape. ? Differences in size. ? Puckers, dips, and bumps in one breast and not the other. 6. Look at each breast for changes in your skin, such as: ? Redness. ? Scaly areas. 7. Look for changes in your nipples, such as: ? Discharge. ? Bleeding. ? Dimpling. ? Redness. ? A change in position. Feel for Changes Carefully feel your breasts for lumps and changes. It is best to do this while lying on your back on the floor and again while sitting or standing in the shower or tub with soapy water on your skin. Feel each breast in the following way:  Place the arm on the side of the breast you are examining above your head.  Feel your breast with the other hand.  Start in the nipple area and make  inch (2 cm) overlapping circles to feel your breast. Use the pads of your three middle fingers to do this. Apply light pressure, then medium pressure, then firm pressure. The light pressure will allow you to feel the tissue closest to the skin. The medium pressure will allow you to feel the tissue that is a little deeper. The firm pressure will allow you to feel the tissue close to the ribs.  Continue the overlapping circles,  moving downward over the breast until you feel your ribs below your breast.  Move one finger-width toward the center of the body. Continue to use the  inch (2 cm) overlapping circles to feel your breast as you move slowly up toward your collarbone.  Continue the up and down exam using all three pressures until you reach your armpit.  Write Down What You Find  Write down what is normal for each breast and any changes that you find. Keep a written record with breast changes or normal findings for each breast. By writing this information down, you do not need to depend only on memory for size, tenderness, or location. Write down where you are in your menstrual cycle, if you are still menstruating. If you are having trouble noticing differences in your breasts, do not get discouraged. With time you will become more familiar with the variations in your breasts and more comfortable with the exam. How often should I examine my breasts? Examine your breasts every month. If you are breastfeeding, the best time to examine your breasts is after a feeding or after using a breast pump. If you menstruate, the best time to   examine your breasts is 5-7 days after your period is over. During your period, your breasts are lumpier, and it may be more difficult to notice changes. When should I see my health care provider? See your health care provider if you notice:  A change in shape or size of your breasts or nipples.  A change in the skin of your breast or nipples, such as a reddened or scaly area.  Unusual discharge from your nipples.  A lump or thick area that was not there before.  Pain in your breasts.  Anything that concerns you. cysAbout Cystocele  Overview  The pelvic organs, including the bladder, are normally supported by pelvic floor muscles and ligaments.  When these muscles and ligaments are stretched, weakened or torn, the wall between the bladder and the vagina sags or herniates causing a  prolapse, sometimes called a cystocele.  This condition may cause discomfort and problems with emptying the bladder.  It can be present in various stages.  Some people are not aware of the changes.  Others may notice changes at the vaginal opening or a feeling of the bladder dropping outside the body.  Causes of a Cystocele  A cystocele is usually caused by muscle straining or stretching during childbirth.  In addition, cystocele is more common after menopause, because the hormone estrogen helps keep the elastic tissues around the pelvic organs strong.  A cystocele is more likely to occur when levels of estrogen decrease.  Other causes include: heavy lifting, chronic coughing, previous pelvic surgery and obesity.  Symptoms  A bladder that has dropped from its normal position may cause: unwanted urine leakage (stress incontinence), frequent urination or urge to urinate, incomplete emptying of the bladder (not feeling bladder relief after emptying), pain or discomfort in the vagina, pelvis, groin, lower back or lower abdomen and frequent urinary tract infections.  Mild cases may not cause any symptoms.  Treatment Options Pelvic floor (Kegel) exercises:  Strength training the muscles in your genital area Behavioral changes: Treating and preventing constipation, taking time to empty your bladder properly, learning to lift properly and/or avoid heavy lifting when possible, stopping smoking, avoiding weight gain and treating a chronic cough or bronchitis. A pessary: A vaginal support device is sometimes used to help pelvic support caused by muscle and ligament changes. Surgery: Surgical repair may be necessary if symptoms cannot be managed with exercise, behavioral changes and a pessary.  Surgery is usually considered for severe cases.   2007, [employer] TherapeuticsAbout Rectocele  Overview  A rectocele is a type of hernia which causes different degrees of bulging of the rectal tissues into the  vaginal wall.  You may even notice that it presses against the vaginal wall so much that some vaginal tissues droop outside of the opening of your vagina.  Causes of Rectocele  The most common cause is childbirth.  The muscles and ligaments in the pelvis that hold up and support the female organs and vagina become stretched and weakened during labor and delivery.  The more babies you have, the more the support tissues are stretched and weakened.  Not everyone who has a baby will develop a rectocele.  Some women have stronger supporting tissue in the pelvis and may not have as much of a problem as others.  Women who have a Cesarean section usually do not get rectocele's unless they pushed a long time prior to the cesarean delivery.  Other conditions that can cause a rectocele include chronic constipation, a chronic  cough, a lot of heavy lifting, and obesity.  Older women may have this problem because the loss of female hormones causes the vaginal tissue to become weaker.  Symptoms  There may not be any symptoms.  If you do have symptoms, they may include:  Pelvic pressure in the rectal area  Protrusion of the lower part of the vagina through the opening of the vagina  Constipation and trapping of the stool, making it difficult to have a bowel movement.  In severe cases, you may have to press on the lower part of your vagina to help push the stool out of you rectum.  This is called splinting to empty.  Diagnosing Rectocele  Your health care provider will ask about your symptoms and perform a pelvic exam.  S/he will ask you to bear down, pushing like you are having a bowel movement so as to see how far the lower part of the vagina protrudes into the vagina and possible outside of the vagina.  Your provider will also ask you to contract the muscles of your pelvis (like you are stopping the stream in the middle of urinating) to determine the strength of your pelvic muscles.  Your provider may also do  a rectal exam.  Treatment Options  If you do not have any symptoms, no treatment may be necessary.  Other treatment options include:  Pelvic floor exercises: Contracting the muscles in your genital area may help strengthen your muscles and support the organs.  Be sure to get proper exercise instruction from you physical therapist.  A pessary (removealbe pelvic support device) sometimes helps rectocele symptoms.  Surgery: Surgical repair may be necessary. In some cases the uterus may need to be taken out ( a hysterectomy) as well.  There are many types of surgery for pelvic support problems.  Look for physicians who specialize in repair procedures.  You can take care of yourself by:  Treating and preventing constipation  Avoiding heavy lifting, and lifting correctly (with your legs, not with you waist or back)  Treating a chronic cough or bronchitis  Not smoking  avoiding too much weight gain  Doing pelvic floor exercises   2007, [employer] Doc.33

## 2020-11-26 DIAGNOSIS — Z1231 Encounter for screening mammogram for malignant neoplasm of breast: Secondary | ICD-10-CM

## 2021-01-15 ENCOUNTER — Other Ambulatory Visit: Payer: Self-pay

## 2021-01-15 ENCOUNTER — Ambulatory Visit
Admission: RE | Admit: 2021-01-15 | Discharge: 2021-01-15 | Disposition: A | Discharge status: Home / Self Care | Payer: 59 | Source: Ambulatory Visit | Attending: Obstetrics and Gynecology | Admitting: Obstetrics and Gynecology

## 2021-01-15 DIAGNOSIS — Z1231 Encounter for screening mammogram for malignant neoplasm of breast: Secondary | ICD-10-CM

## 2021-03-04 DIAGNOSIS — H5213 Myopia, bilateral: Secondary | ICD-10-CM | POA: Diagnosis not present

## 2021-03-04 DIAGNOSIS — H524 Presbyopia: Secondary | ICD-10-CM | POA: Diagnosis not present

## 2021-03-04 DIAGNOSIS — H52221 Regular astigmatism, right eye: Secondary | ICD-10-CM | POA: Diagnosis not present

## 2021-03-04 DIAGNOSIS — H0011 Chalazion right upper eyelid: Secondary | ICD-10-CM | POA: Diagnosis not present

## 2021-04-05 ENCOUNTER — Other Ambulatory Visit (HOSPITAL_BASED_OUTPATIENT_CLINIC_OR_DEPARTMENT_OTHER): Payer: Self-pay

## 2021-04-05 DIAGNOSIS — Z23 Encounter for immunization: Secondary | ICD-10-CM | POA: Diagnosis not present

## 2021-04-05 DIAGNOSIS — E782 Mixed hyperlipidemia: Secondary | ICD-10-CM | POA: Diagnosis not present

## 2021-04-05 DIAGNOSIS — R739 Hyperglycemia, unspecified: Secondary | ICD-10-CM | POA: Diagnosis not present

## 2021-04-05 DIAGNOSIS — J45990 Exercise induced bronchospasm: Secondary | ICD-10-CM | POA: Diagnosis not present

## 2021-04-05 DIAGNOSIS — E559 Vitamin D deficiency, unspecified: Secondary | ICD-10-CM | POA: Diagnosis not present

## 2021-04-05 DIAGNOSIS — Z5181 Encounter for therapeutic drug level monitoring: Secondary | ICD-10-CM | POA: Diagnosis not present

## 2021-04-05 DIAGNOSIS — Z0000 Encounter for general adult medical examination without abnormal findings: Secondary | ICD-10-CM | POA: Diagnosis not present

## 2021-04-05 MED ORDER — ZOLPIDEM TARTRATE 5 MG PO TABS
ORAL_TABLET | ORAL | 5 refills | Status: DC
  Filled 2021-04-05 – 2021-04-08 (×2): qty 30, 30d supply, fill #0
  Filled 2021-07-12: qty 30, 30d supply, fill #1
  Filled 2021-09-27: qty 30, 30d supply, fill #2

## 2021-04-07 ENCOUNTER — Other Ambulatory Visit: Payer: Self-pay | Admitting: Family Medicine

## 2021-04-07 DIAGNOSIS — E2839 Other primary ovarian failure: Secondary | ICD-10-CM

## 2021-04-08 ENCOUNTER — Other Ambulatory Visit (HOSPITAL_BASED_OUTPATIENT_CLINIC_OR_DEPARTMENT_OTHER): Payer: Self-pay

## 2021-04-09 ENCOUNTER — Other Ambulatory Visit (HOSPITAL_BASED_OUTPATIENT_CLINIC_OR_DEPARTMENT_OTHER): Payer: Self-pay

## 2021-06-15 ENCOUNTER — Other Ambulatory Visit (HOSPITAL_BASED_OUTPATIENT_CLINIC_OR_DEPARTMENT_OTHER): Payer: Self-pay

## 2021-06-15 ENCOUNTER — Ambulatory Visit: Payer: 59 | Attending: Internal Medicine

## 2021-06-15 DIAGNOSIS — Z23 Encounter for immunization: Secondary | ICD-10-CM

## 2021-06-15 MED ORDER — PFIZER COVID-19 VAC BIVALENT 30 MCG/0.3ML IM SUSP
INTRAMUSCULAR | 0 refills | Status: DC
  Filled 2021-06-15: qty 0.3, 1d supply, fill #0

## 2021-06-15 NOTE — Progress Notes (Signed)
   Covid-19 Vaccination Clinic  Name:  Julie Dorsey    MRN: [id number] DOB: 01-19-1966  06/15/2021  [name] was observed post Covid-19 immunization for 15 minutes without incident. She was provided with Vaccine Information Sheet and instruction to access the V-Safe system.   [name] was instructed to call 911 with any severe reactions post vaccine: Difficulty breathing  Swelling of face and throat  A fast heartbeat  A bad rash all over body  Dizziness and weakness   Immunizations Administered     Name Date Dose VIS Date Route   Pfizer Covid-19 Vaccine Bivalent Booster 06/15/2021  1:49 PM 0.3 mL 04/21/2021 Intramuscular   Manufacturer: [place]   Lot: [id number]   [place]: [id number]

## 2021-07-06 DIAGNOSIS — H52221 Regular astigmatism, right eye: Secondary | ICD-10-CM | POA: Diagnosis not present

## 2021-07-06 DIAGNOSIS — H524 Presbyopia: Secondary | ICD-10-CM | POA: Diagnosis not present

## 2021-07-06 DIAGNOSIS — H5213 Myopia, bilateral: Secondary | ICD-10-CM | POA: Diagnosis not present

## 2021-07-06 DIAGNOSIS — H33303 Unspecified retinal break, bilateral: Secondary | ICD-10-CM | POA: Diagnosis not present

## 2021-07-06 DIAGNOSIS — Z23 Encounter for immunization: Secondary | ICD-10-CM | POA: Diagnosis not present

## 2021-07-12 DIAGNOSIS — H52221 Regular astigmatism, right eye: Secondary | ICD-10-CM | POA: Diagnosis not present

## 2021-07-12 DIAGNOSIS — H524 Presbyopia: Secondary | ICD-10-CM | POA: Diagnosis not present

## 2021-07-12 DIAGNOSIS — H3530 Unspecified macular degeneration: Secondary | ICD-10-CM | POA: Diagnosis not present

## 2021-07-12 DIAGNOSIS — H5213 Myopia, bilateral: Secondary | ICD-10-CM | POA: Diagnosis not present

## 2021-07-13 ENCOUNTER — Other Ambulatory Visit (HOSPITAL_BASED_OUTPATIENT_CLINIC_OR_DEPARTMENT_OTHER): Payer: Self-pay

## 2021-08-26 DIAGNOSIS — H5310 Unspecified subjective visual disturbances: Secondary | ICD-10-CM | POA: Diagnosis not present

## 2021-08-26 DIAGNOSIS — H524 Presbyopia: Secondary | ICD-10-CM | POA: Diagnosis not present

## 2021-08-26 DIAGNOSIS — H3561 Retinal hemorrhage, right eye: Secondary | ICD-10-CM | POA: Diagnosis not present

## 2021-09-09 ENCOUNTER — Other Ambulatory Visit: Payer: 59

## 2021-09-27 ENCOUNTER — Other Ambulatory Visit (HOSPITAL_BASED_OUTPATIENT_CLINIC_OR_DEPARTMENT_OTHER): Payer: Self-pay

## 2021-11-04 ENCOUNTER — Other Ambulatory Visit: Payer: Self-pay | Admitting: Family Medicine

## 2021-11-05 ENCOUNTER — Ambulatory Visit: Payer: 59 | Admitting: Family Medicine

## 2021-11-05 ENCOUNTER — Other Ambulatory Visit: Payer: Self-pay | Admitting: Family Medicine

## 2021-11-12 ENCOUNTER — Encounter: Payer: Self-pay | Admitting: Family Medicine

## 2021-11-12 ENCOUNTER — Ambulatory Visit: Payer: 59 | Admitting: Family Medicine

## 2021-11-12 ENCOUNTER — Other Ambulatory Visit (HOSPITAL_BASED_OUTPATIENT_CLINIC_OR_DEPARTMENT_OTHER): Payer: Self-pay

## 2021-11-12 VITALS — BP 102/64 | HR 52 | Ht 65.0 in | Wt 144.0 lb

## 2021-11-12 DIAGNOSIS — G4700 Insomnia, unspecified: Secondary | ICD-10-CM

## 2021-11-12 DIAGNOSIS — Z8262 Family history of osteoporosis: Secondary | ICD-10-CM | POA: Diagnosis not present

## 2021-11-12 DIAGNOSIS — Z780 Asymptomatic menopausal state: Secondary | ICD-10-CM

## 2021-11-12 MED ORDER — ZOLPIDEM TARTRATE 5 MG PO TABS
5.0000 mg | ORAL_TABLET | Freq: Every evening | ORAL | 0 refills | Status: DC | PRN
  Filled 2021-11-12: qty 30, 30d supply, fill #0

## 2021-11-12 NOTE — Patient Instructions (Addendum)
Recommended daily Calcium intake for your adult women in 1200 mg daily. Recommended daily Vitamin D intake is 600-800 units.  ? ? ?Thank you for choosing [facility] at [facility] for your Primary Care needs. I am excited for the opportunity to partner with you to meet your health care goals. It was a pleasure meeting you today! ? ? ? ?Information on diet, exercise, and health maintenance recommendations are listed below. This is information to help you be sure you are on track for optimal health and monitoring.  ? ?Please look over this and let us know if you have any questions or if you have completed any of the health maintenance outside of [facility] so that we can be sure your records are up to date.  ?___________________________________________________________ ? ?MyChart:  ?For all urgent or time sensitive needs we ask that you please call the office to avoid delays. Our number is [phone]. ?MyChart is not constantly monitored and due to the large volume of messages a day, replies may take up to 72 business hours. ? ?MyChart Policy: ?MyChart allows for you to see your visit notes, after visit summary, provider recommendations, lab and tests results, make an appointment, request refills, and contact your provider or the office for non-urgent questions or concerns. Providers are seeing patients during normal business hours and do not have built in time to review MyChart messages.  ?We ask that you allow a minimum of 3 business days for responses to [employer]. For this reason, please do not send urgent requests through [facility]. Please call the office at [phone]. ?New and ongoing conditions may require a visit. We have virtual and in-person visits available for your convenience.  ?Complex MyChart concerns may require a visit. Your provider may request you schedule a virtual or in-person visit to ensure we are providing the best care possible. ?MyChart messages sent after 11:00  AM on Friday will not be received by the provider until Monday morning.  ?  ?Lab and Test Results: ?You will receive your lab and test results on MyChart as soon as they are completed and results have been sent by the lab or testing facility. Due to this service, you will receive your results BEFORE your provider.  ?I review lab and test results each morning prior to seeing patients. Some results require collaboration with other providers to ensure you are receiving the most appropriate care. For this reason, we ask that you please allow a minimum of 3-5 business days from the time that ALL results have been received for your provider to receive and review lab and test results and contact you about these.  ?Most lab and test result comments from the provider will be sent through [facility]. Your provider may recommend changes to the plan of care, follow-up visits, repeat testing, ask questions, or request an office visit to discuss these results. You may reply directly to this message or call the office to provide information for the provider or set up an appointment. ?In some instances, you will be called with test results and recommendations. Please let us know if this is preferred and we will make note of this in your chart to provide this for you.    ?If you have not heard a response to your lab or test results in 5 business days from all results returning to [facility], please call the office to let us know. We ask that you please avoid calling prior to this time unless there  is an emergent concern. Due to high call volumes, this can delay the resulting process. ? ?After Hours: ?For all non-emergency after hours needs, please call the office at [phone] and select the option to reach the on-call  service. On-call services are shared between multiple [facility] offices and therefore it will not be possible to speak directly with your provider. On-call providers may provide medical advice and recommendations, but  are unable to provide refills for maintenance medications.  ?For all emergency or urgent medical needs after normal business hours, we recommend that you seek care at the closest Urgent Care or Emergency Department to ensure appropriate treatment in a timely manner.  ?[facility] has a 24 hour emergency room located on the ground floor for your convenience.  ? ?Urgent Concerns During the Business Day ?Providers are seeing patients from 8AM to [place] with a busy schedule and are most often not able to respond to non-urgent calls until the end of the day or the next business day. ?If you should have URGENT concerns during the day, please call and speak to the nurse or schedule a same day appointment so that we can address your concern without delay.  ? ?Thank you, again, for choosing me as your health care partner. I appreciate your trust and look forward to learning more about you.  ? ?[facility] Nails. Olevia Bowens, DNP, FNP-C ? ?___________________________________________________________ ? ?Health Maintenance Recommendations ?Screening Testing ?Mammogram ?Every 1-2 years based on history and risk factors ?Starting at age 61 ?Pap Smear ?Ages 21-39 every 3 years ?Ages 36-65 every 5 years with HPV testing ?More frequent testing may be required based on results and history ?Colon Cancer Screening ?Every 1-10 years based on test performed, risk factors, and history ?Starting at age 101 ?Bone Density Screening ?Every 2-10 years based on history ?Starting at age 2 for women ?Recommendations for men differ based on medication usage, history, and risk factors ?AAA Screening ?One time ultrasound ?Men 30-28 years old who have ever smoked ?Lung Cancer Screening ?Low Dose Lung CT every 12 months ?Age 38-80 years with a 20 pack-year smoking history who still smoke or who have quit within the last 15 years ? ?Screening Labs ?Routine  Labs: Complete Blood Count (CBC), Complete Metabolic Panel (CMP), Cholesterol (Lipid  Panel) ?Every 6-12 months based on history and medications ?May be recommended more frequently based on current conditions or previous results ?Hemoglobin A1c Lab ?Every 3-12 months based on history and previous results ?Starting at age 65 or earlier with diagnosis of diabetes, high cholesterol, BMI >26, and/or risk factors ?Frequent monitoring for patients with diabetes to ensure blood sugar control ?Thyroid Panel (TSH w/ T3 & T4) ?Every 6 months based on history, symptoms, and risk factors ?May be repeated more often if on medication ?HIV ?One time testing for all patients 70 and older ?May be repeated more frequently for patients with increased risk factors or exposure ?Hepatitis C ?One time testing for all patients 90 and older ?May be repeated more frequently for patients with increased risk factors or exposure ?Gonorrhea, Chlamydia ?Every 12 months for all sexually active persons 13-24 years ?Additional monitoring may be recommended for those who are considered high risk or who have symptoms ?PSA ?Men 18-74 years old with risk factors ?Additional screening may be recommended from age 107-69 based on risk factors, symptoms, and history ? ?Vaccine Recommendations ?Tetanus Booster ?All adults every 10 years ?Flu Vaccine ?All patients 6 months and older every year ?COVID Vaccine ?All patients  12 years and older ?Initial dosing with booster ?May recommend additional booster based on age and health history ?HPV Vaccine ?2 doses all patients age 33-26 ?Dosing may be considered for patients over 26 ?Shingles Vaccine (Shingrix) ?2 doses all adults 50 years and older ?Pneumonia (Pneumovax 23) ?All adults 60 years and older ?May recommend earlier dosing based on health history ?Pneumonia (Prevnar 76) ?All adults 35 years and older ?Dosed 1 year after Pneumovax 23 ?Pneumonia (Prevnar 16) ?All adults 74 years and older (adults 16-10 with certain conditions or risk factors) ?1 dose  ?For those who have no received Prevnar 13  vaccine previously ? ? ?Additional Screening, Testing, and Vaccinations may be recommended on an individualized basis based on family history, health history, risk factors, and/or exposure.  ?_______________

## 2021-11-12 NOTE — Progress Notes (Signed)
? ?______________________________________________________________________ ? ?HPI ?Julie Dorsey is a 56 y.o. female presenting to [facility] at [facility] today to establish care. Previous provider at [facility] left and she needs new provider.  ? ?Patient Care Team: ?Terrilyn Saver, NP as PCP - General (Family Medicine) ?Clark-Burning, Anderson Malta, PA-C (Inactive) (Dermatology) ? ?Health Maintenance  ?Topic Date Due  ? HIV Screening  Never done  ? Hepatitis C Screening: USPSTF Recommendation to screen - Ages 29-79 yo.  Never done  ? Colon Cancer Screening  Never done  ? Pap Smear  10/23/2021  ? Mammogram  01/16/2023  ? Tetanus Vaccine  11/04/2028  ? Flu Shot  Completed  ? COVID-19 Vaccine  Completed  ? Zoster (Shingles) Vaccine  Completed  ? HPV Vaccine  Aged Out  ? ? ? ?Concerns today: ?Insomnia - takes occasional Ambien (one to two nights per week typically). Tolerating well. No side effects.  ?Previous PCP ordered baseline DEXA (family history of osteoporosis, menopause around age 72). No problems other than "shrinking" 1/2 an inch in the past several years. She does regular physical activity with weight bearing activities and takes calcium and vitamin D. No falls/fractures. She would like to proceed with screening if insurance will pay for it.  ? ? ?Patient Active Problem List  ? Diagnosis Date Noted  ? Exercise-induced asthma 11/18/2020  ? Hyperglycemia 11/18/2020  ? Insomnia 11/18/2020  ? Midline cystocele 11/18/2020  ? Mixed hyperlipidemia 11/18/2020  ? Pelvic congestion 11/18/2020  ? Personal history of colonic polyps 11/18/2020  ? Rectal bleeding 11/18/2020  ? ? ? ? ?______________________________________________________________________ ?PMH ?Past Medical History:  ?Diagnosis Date  ? Hx of retinal hemorrhage   ? right eye  ? ? ?ROS ?All review of systems negative except what is listed in the HPI ? ?PHYSICAL EXAM ?Physical Exam ?Vitals reviewed.  ?Constitutional:   ?   Appearance: Normal  appearance.  ?Cardiovascular:  ?   Rate and Rhythm: Normal rate and regular rhythm.  ?Pulmonary:  ?   Effort: Pulmonary effort is normal.  ?   Breath sounds: Normal breath sounds.  ?Skin: ?   General: Skin is warm and dry.  ?Neurological:  ?   General: No focal deficit present.  ?   Mental Status: She is alert and oriented to person, place, and time. Mental status is at baseline.  ?Psychiatric:     ?   Mood and Affect: Mood normal.     ?   Behavior: Behavior normal.     ?   Thought Content: Thought content normal.     ?   Judgment: Judgment normal.  ? ?______________________________________________________________________ ?ASSESSMENT AND PLAN ? ?1. Insomnia, unspecified type ?Refilled Ambien. Tolerating well with only PRN use. PDMP reviewed. ?- zolpidem (AMBIEN) 5 MG tablet; Take 1 tablet (5 mg total) by mouth at bedtime as needed for sleep.  Dispense: 30 tablet; Refill: 0 ? ?2. Family history of osteoporosis ?3. Post-menopause ?Screening ordered. Discussed daily recommended intake of Ca/Vit D for all adult women.  ?- DG Bone Density; Future ? ?Establish care  ?Education provided today during visit and on AVS for patient to review at home.  ?Diet and Exercise recommendations provided.  ?Current diagnoses and recommendations discussed. ?HM recommendations reviewed with recommendations.  ? ? ?Outpatient Encounter Medications as of 11/12/2021  ?Medication Sig  ? Multiple Vitamin (MULTIVITAMIN) capsule Take 1 capsule by mouth daily.  ? zolpidem (AMBIEN) 5 MG tablet Take 1 tablet (5 mg total) by mouth at bedtime  as needed for sleep.  ? [DISCONTINUED] Calcium Citrate-Vitamin D (CALCIUM + D PO) Take by mouth.  ? [DISCONTINUED] COVID-19 mRNA bivalent vaccine, Pfizer, (PFIZER COVID-19 VAC BIVALENT) injection Inject into the muscle.  ? [DISCONTINUED] Melatonin 10 MG CAPS Take 10 mg by mouth at bedtime.  ? [DISCONTINUED] zolpidem (AMBIEN) 5 MG tablet Take 5 mg by mouth at bedtime as needed for sleep.  ? [DISCONTINUED] zolpidem  (AMBIEN) 5 MG tablet Take 1 tablet (5 mg) by mouth once daily.  ? ?No facility-administered encounter medications on file as of 11/12/2021.  ? ? ?Return for physical in the Fall with fasting labs . ? ? ? ?[facility] Nails Olevia Bowens, DNP, FNP-C ? ? ?

## 2021-11-19 ENCOUNTER — Telehealth (HOSPITAL_BASED_OUTPATIENT_CLINIC_OR_DEPARTMENT_OTHER): Payer: Self-pay

## 2021-11-24 ENCOUNTER — Ambulatory Visit: Payer: 59 | Admitting: Obstetrics and Gynecology

## 2021-12-22 NOTE — Progress Notes (Signed)
56 y.o. [id number] Married White or Caucasian Not Hispanic or Latino female here for annual exam.  No vaginal bleeding. No dyspareunia. ? ?No bowel or bladder c/o.  ?  ?H/O mild cystocele and mild rectocele, not bothersome. ? ? ?No LMP recorded. Patient is postmenopausal.          ?Sexually active: Yes.    ?The current method of family planning is post menopausal status.    ?Exercising: Yes.     Cardio and yoga  ?Smoker:  no ? ?Health Maintenance: ?Pap:  10/24/18 normal Neg HR HPV  07/08/15 normal Neg Hr HPV  ?History of abnormal Pap:  no ?MMG:  01/15/21 Density B Bi-rads 1 neg  ?BMD:   none ?Colonoscopy:10/15/19, benign polyps repeat in 5 years ?TDaP:  2020 ?Gardasil: NA ? ? reports that she has never smoked. She has never used smokeless tobacco. She reports current alcohol use. She reports that she does not use drugs. She is an oncology Nutritional therapist. Son is 16, MD, has an 73 month old daughter, moving to [place]. Daughter is 80 works in [employer], getting her MBA.  ?  ? ?Past Medical History:  ?Diagnosis Date  ? Hx of retinal hemorrhage   ? right eye  ? ? ?Past Surgical History:  ?Procedure Laterality Date  ? DIGIT NAIL REMOVAL Right 04/2017  ? right toe nail removed  ? WISDOM TOOTH EXTRACTION    ? ? ?Current Outpatient Medications  ?Medication Sig Dispense Refill  ? Multiple Vitamin (MULTIVITAMIN) capsule Take 1 capsule by mouth daily.    ? zolpidem (AMBIEN) 5 MG tablet Take 1 tablet (5 mg total) by mouth at bedtime as needed for sleep. 30 tablet 0  ? ?No current facility-administered medications for this visit.  ? ? ?Family History  ?Problem Relation Age of Onset  ? Hypertension Mother   ? Breast cancer Mother 39  ? Hypertension Father   ? Cancer Father 76  ?     ureter cancer  ? Hearing loss Maternal Grandmother   ? Heart disease Maternal Grandmother   ? Heart disease Maternal Grandfather   ? Hearing loss Paternal Grandmother   ? Heart disease Paternal Grandmother   ? Stroke Paternal Grandfather   ? Diabetes Paternal  Grandfather   ? Heart disease Paternal Grandfather   ? ? ?Review of Systems  ?All other systems reviewed and are negative. ? ?Exam:   ?BP 112/68   Pulse (!) 51   Ht 5' 4.5" (1.638 m)   Wt 144 lb 12.8 oz (65.7 kg)   SpO2 100%   BMI 24.47 kg/m?   Weight change: '@WEIGHTCHANGE'$ @ Height:   Height: 5' 4.5" (163.8 cm)  ?Ht Readings from Last 3 Encounters:  ?12/30/21 5' 4.5" (1.638 m)  ?11/12/21 '5\' 5"'$  (1.651 m)  ?11/18/20 5' 5.5" (1.664 m)  ? ? ?General appearance: alert, cooperative and appears stated age ?Head: Normocephalic, without obvious abnormality, atraumatic ?Neck: no adenopathy, supple, symmetrical, trachea midline and thyroid normal to inspection and palpation ?Lungs: clear to auscultation bilaterally ?Cardiovascular: regular rate and rhythm ?Breasts: normal appearance, no masses or tenderness ?Abdomen: soft, non-tender; non distended,  no masses,  no organomegaly ?Extremities: extremities normal, atraumatic, no cyanosis or edema ?Skin: Skin color, texture, turgor normal. No rashes or lesions ?Lymph nodes: Cervical, supraclavicular, and axillary nodes normal. ?No abnormal inguinal nodes palpated ?Neurologic: Grossly normal ? ? ?Pelvic: External genitalia:  no lesions ?             Urethra:  normal  appearing urethra with no masses, tenderness or lesions ?             Bartholins and Skenes: normal    ?             Vagina: mildly atrophic appearing vagina with small gr ?             Cervix: no lesions ?              ?Bimanual Exam:  Uterus:  normal size, contour, position, consistency, mobility, non-tender ?             Adnexa: no mass, fullness, tenderness ?              Rectovaginal: Confirms ?              Anus:  normal sphincter tone, no lesions ? ?Karmen Bongo, RN chaperoned for the exam. ? ?1. Well woman exam ?Discussed breast self exam ?Discussed calcium and vit D intake ?Labs with primary ?Mammogram is due ?Colonoscopy is UTD ? ? ?

## 2021-12-30 ENCOUNTER — Encounter: Payer: Self-pay | Admitting: Obstetrics and Gynecology

## 2021-12-30 ENCOUNTER — Ambulatory Visit (INDEPENDENT_AMBULATORY_CARE_PROVIDER_SITE_OTHER): Payer: 59 | Admitting: Obstetrics and Gynecology

## 2021-12-30 VITALS — BP 112/68 | HR 51 | Ht 64.5 in | Wt 144.8 lb

## 2021-12-30 DIAGNOSIS — Z01419 Encounter for gynecological examination (general) (routine) without abnormal findings: Secondary | ICD-10-CM | POA: Diagnosis not present

## 2021-12-30 NOTE — Patient Instructions (Signed)

## 2022-01-06 ENCOUNTER — Other Ambulatory Visit: Payer: Self-pay | Admitting: Obstetrics and Gynecology

## 2022-01-06 DIAGNOSIS — Z1231 Encounter for screening mammogram for malignant neoplasm of breast: Secondary | ICD-10-CM

## 2022-02-03 ENCOUNTER — Ambulatory Visit
Admission: RE | Admit: 2022-02-03 | Discharge: 2022-02-03 | Disposition: A | Discharge status: Home / Self Care | Payer: 59 | Source: Ambulatory Visit | Attending: Obstetrics and Gynecology | Admitting: Obstetrics and Gynecology

## 2022-02-03 DIAGNOSIS — Z1231 Encounter for screening mammogram for malignant neoplasm of breast: Secondary | ICD-10-CM | POA: Diagnosis not present

## 2022-02-07 DIAGNOSIS — L570 Actinic keratosis: Secondary | ICD-10-CM | POA: Diagnosis not present

## 2022-02-07 DIAGNOSIS — L821 Other seborrheic keratosis: Secondary | ICD-10-CM | POA: Diagnosis not present

## 2022-02-07 DIAGNOSIS — L814 Other melanin hyperpigmentation: Secondary | ICD-10-CM | POA: Diagnosis not present

## 2022-02-07 DIAGNOSIS — D225 Melanocytic nevi of trunk: Secondary | ICD-10-CM | POA: Diagnosis not present

## 2022-05-24 ENCOUNTER — Telehealth: Payer: Self-pay | Admitting: Family

## 2022-05-24 ENCOUNTER — Ambulatory Visit (INDEPENDENT_AMBULATORY_CARE_PROVIDER_SITE_OTHER): Payer: 59 | Admitting: Family

## 2022-05-24 ENCOUNTER — Encounter: Payer: Self-pay | Admitting: Family

## 2022-05-24 ENCOUNTER — Other Ambulatory Visit (HOSPITAL_BASED_OUTPATIENT_CLINIC_OR_DEPARTMENT_OTHER): Payer: Self-pay

## 2022-05-24 VITALS — BP 104/74 | HR 50 | Temp 98.2°F | Resp 18 | Ht 65.0 in | Wt 144.0 lb

## 2022-05-24 DIAGNOSIS — G4700 Insomnia, unspecified: Secondary | ICD-10-CM | POA: Diagnosis not present

## 2022-05-24 DIAGNOSIS — E785 Hyperlipidemia, unspecified: Secondary | ICD-10-CM

## 2022-05-24 DIAGNOSIS — R739 Hyperglycemia, unspecified: Secondary | ICD-10-CM

## 2022-05-24 DIAGNOSIS — Z0000 Encounter for general adult medical examination without abnormal findings: Secondary | ICD-10-CM | POA: Insufficient documentation

## 2022-05-24 LAB — LIPID PANEL
Cholesterol: 250 mg/dL — ABNORMAL HIGH (ref 0–200)
HDL: 105.1 mg/dL (ref >39.00)
LDL Cholesterol: 134 mg/dL — ABNORMAL HIGH (ref 0–99)
NonHDL: 144.6
Total CHOL/HDL Ratio: 2
Triglycerides: 51 mg/dL (ref 0.0–149.0)
VLDL: 10.2 mg/dL (ref 0.0–40.0)

## 2022-05-24 LAB — HEMOGLOBIN A1C: Hgb A1c MFr Bld: 6 % (ref 4.6–6.5)

## 2022-05-24 LAB — COMPREHENSIVE METABOLIC PANEL
ALT: 29 U/L (ref 0–35)
AST: 34 U/L (ref 0–37)
Albumin: 4.5 g/dL (ref 3.5–5.2)
Alkaline Phosphatase: 54 U/L (ref 39–117)
BUN: 14 mg/dL (ref 6–23)
CO2: 28 mEq/L (ref 19–32)
Calcium: 9.6 mg/dL (ref 8.4–10.5)
Chloride: 103 mEq/L (ref 96–112)
Creatinine, Ser: 0.8 mg/dL (ref 0.40–1.20)
GFR: 81.61 mL/min (ref >60.00)
Glucose, Bld: 87 mg/dL (ref 70–99)
Potassium: 4.6 mEq/L (ref 3.5–5.1)
Sodium: 139 mEq/L (ref 135–145)
Total Bilirubin: 0.8 mg/dL (ref 0.2–1.2)
Total Protein: 6.7 g/dL (ref 6.0–8.3)

## 2022-05-24 MED ORDER — ZOLPIDEM TARTRATE 5 MG PO TABS
5.0000 mg | ORAL_TABLET | Freq: Every evening | ORAL | 0 refills | Status: DC | PRN
  Filled 2022-05-24: qty 30, 30d supply, fill #0

## 2022-05-24 NOTE — Assessment & Plan Note (Addendum)
Diet is healthy, regular exercise. Colo up to date. Will request records from [facility].  Pap is due- this is done by GYN. Mammogram up to date.

## 2022-05-24 NOTE — Telephone Encounter (Signed)
Please call [facility] and request a copy of colonoscopy report.

## 2022-05-24 NOTE — Assessment & Plan Note (Signed)
Pt would like to repeat A1C. Was told "borderline diabetes" last year at [facility].

## 2022-05-24 NOTE — Telephone Encounter (Signed)
Rec release sent for Colonoscopy.

## 2022-05-24 NOTE — Progress Notes (Addendum)
Subjective:   By signing my name below, I, [name], attest that this documentation has been prepared under the direction and in the presence of Julie Chimera, NP 05/24/2022     Patient ID: Julie Dorsey, female    DOB: Feb 20, 1966, 56 y.o.   MRN: [id number]  Chief Complaint  Patient presents with   Annual Exam    Concerns/ questions: none Flu shot today: schedule for Friday Hep C/HIV screen due Pap: sees Gyn Colon: due in 8 years    HPI Patient is in today for a comprehensive physical exam   Refills: She is requesting a refill of 5 Mg of Ambien.   Blood Pressure: She states that her low heart rate is normal for her. She denies of any dizziness BP Readings from Last 3 Encounters:  05/24/22 104/74  12/30/21 112/68  11/12/21 102/64   Pulse Readings from Last 3 Encounters:  05/24/22 (!) 50  12/30/21 (!) 51  11/12/21 (!) 52   Skin Mole: She reports that she went to dermatology to freeze a skin mole from her forearm. She regularly follows [place] with her dermatologist.   Insomnia/Mood: She currently takes 5 Mg of Ambien PRN. This is helpful for her.   She denies having any fever, new muscle pain, joint pain , new moles, rashes, congestion, sinus pain, sore throat, palpations, cough, SOB ,wheezing,n/v/d constipation, blood in stool, dysuria, frequency, hematuria, depression, anxiety, headaches at this time  Social history: She reports no recent surgeries. She reports that her mother has macular degeneration.  Colonoscopy: She had a colonoscopy in 2021 at [facility] Pap Smear: Last completed on 10/24/2018. She goes to gynecology for her pap smears  Mammogram: Last completed on 02/03/2022 Immunizations: She is UTD on tetanus and Shingles vaccine. She has an influenza vaccine scheduled for 05/27/2022.  Diet: She is improving her diet Exercise: She is improving exercise  Dental: She is UTD on dental exams Vision: She is UTD on vision exams   Health  Maintenance Due  Topic Date Due   HIV Screening  Never done   Hepatitis C Screening  Never done   COLONOSCOPY (Pts 45-24yr Insurance coverage will need to be confirmed)  Never done   PAP SMEAR-Modifier  10/23/2021   INFLUENZA VACCINE  03/22/2022    Past Medical History:  Diagnosis Date   Hx of retinal hemorrhage    right eye    Past Surgical History:  Procedure Laterality Date   DIGIT NAIL REMOVAL Right 04/2017   right toe nail removed   WISDOM TOOTH EXTRACTION      Family History  Problem Relation Age of Onset   Hypertension Mother    Breast cancer Mother 475  Macular degeneration Mother    Hypertension Father    Cancer Father 645      ureter cancer   Hearing loss Maternal Grandmother    Heart disease Maternal Grandmother    Heart disease Maternal Grandfather    Hearing loss Paternal Grandmother    Heart disease Paternal Grandmother    Stroke Paternal Grandfather    Diabetes Paternal Grandfather    Heart disease Paternal Grandfather     Social History   Socioeconomic History   Marital status: Married    Spouse name: Not on file   Number of children: Not on file   Years of education: Not on file   Highest education level: Not on file  Occupational History   Not on file  Tobacco Use  Smoking status: Never   Smokeless tobacco: Never  Vaping Use   Vaping Use: Never used  Substance and Sexual Activity   Alcohol use: Yes    Comment: 2 glasses of wine per month   Drug use: Never   Sexual activity: Yes    Partners: Male    Birth control/protection: Post-menopausal  Other Topics Concern   Not on file  Social History Narrative   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     zolpidem (AMBIEN) 5 MG tablet Take 1 tablet (5 mg total) by mouth at bedtime as needed for sleep. 30 tablet 0   No facility-administered medications prior to visit.    Allergies  Allergen Reactions   Penicillins Hives    Review of Systems  Constitutional:  Negative for fever.  HENT:  Negative for congestion, sinus pain and sore throat.   Eyes:        (+) Right Eyelid Droop (mild, being watched by ophthalmology).  Respiratory:  Negative for cough, shortness of breath and wheezing.   Cardiovascular:  Negative for palpitations.  Gastrointestinal:  Negative for blood in stool, constipation, diarrhea, nausea and vomiting.  Genitourinary:  Negative for dysuria, frequency and hematuria.  Musculoskeletal:  Negative for joint pain and myalgias.  Skin:  Negative for rash.       (-) New Moles  Neurological:  Negative for headaches.  Psychiatric/Behavioral:  Negative for depression. The patient is not nervous/anxious.        Objective:    Physical Exam Constitutional:      General: She is not in acute distress.    Appearance: Normal appearance. She is not ill-appearing.  HENT:     Head: Normocephalic and atraumatic.     Right Ear: Tympanic membrane, ear canal and external ear normal.     Left Ear: Tympanic membrane, ear canal and external ear normal.  Eyes:     Extraocular Movements: Extraocular movements intact.     Pupils: Pupils are equal, round, and reactive to light.  Cardiovascular:     Rate and Rhythm: Normal rate and regular rhythm.     Heart sounds: Normal heart sounds. No murmur heard.    No gallop.  Pulmonary:     Effort: Pulmonary effort is normal. No respiratory distress.     Breath sounds: Normal breath sounds. No wheezing or rales.  Abdominal:     General: Bowel sounds are normal. There is no distension.     Palpations: Abdomen  is soft.     Tenderness: There is no abdominal tenderness. There is no guarding.  Musculoskeletal:     Comments: 5/5 strength in both upper and lower extremities    Skin:    General: Skin is warm and dry.  Neurological:     Mental Status: She is alert and oriented to person, place, and time.     Deep Tendon Reflexes:     Reflex Scores:      Patellar reflexes are 2+ on the right side and 2+ on the left side. Psychiatric:  Mood and Affect: Mood normal.        Behavior: Behavior normal.        Judgment: Judgment normal.     BP 104/74 (BP Location: Left Arm, Patient Position: Sitting, Cuff Size: Normal)   Pulse (!) 50   Temp 98.2 F (36.8 C) (Oral)   Resp 18   Ht 5' 5"  (1.651 m)   Wt 144 lb (65.3 kg)   SpO2 100%   BMI 23.96 kg/m  Wt Readings from Last 3 Encounters:  05/24/22 144 lb (65.3 kg)  12/30/21 144 lb 12.8 oz (65.7 kg)  11/12/21 144 lb (65.3 kg)       Assessment & Plan:   Problem List Items Addressed This Visit       Unprioritized   Preventative health care - Primary    Diet is healthy, regular exercise. Colo up to date. Will request records from [facility].  Pap is due- this is done by GYN. Mammogram up to date.       Insomnia    Stable with prn ambien. Controlled substance contract is signed today.       Relevant Medications   zolpidem (AMBIEN) 5 MG tablet   Hyperglycemia    Pt would like to repeat A1C. Was told "borderline diabetes" last year at [place].       Relevant Orders   Hemoglobin A1c   Other Visit Diagnoses     Hyperlipidemia, unspecified hyperlipidemia type       Relevant Orders   Comp Met (CMET)   Lipid panel      Meds ordered this encounter  Medications   zolpidem (AMBIEN) 5 MG tablet    Sig: Take 1 tablet (5 mg total) by mouth at bedtime as needed for sleep.    Dispense:  30 tablet    Refill:  0    Order Specific Question:   Supervising Provider    Answer:   Penni Homans A [[id number]]    I, Nance Pear, NP,  personally preformed the services described in this documentation.  All medical record entries made by the scribe were at my direction and in my presence.  I have reviewed the chart and discharge instructions (if applicable) and agree that the record reflects my personal performance and is accurate and complete. 05/24/2022   I,Amber Collins,acting as a scribe for Nance Pear, NP.,have documented all relevant documentation on the behalf of Nance Pear, NP,as directed by  Nance Pear, NP while in the presence of Nance Pear, NP.    Nance Pear, NP

## 2022-05-24 NOTE — Assessment & Plan Note (Signed)
Stable with prn ambien. Controlled substance contract is signed today.

## 2022-05-26 ENCOUNTER — Telehealth: Payer: Self-pay | Admitting: Family

## 2022-05-26 DIAGNOSIS — R7303 Prediabetes: Secondary | ICD-10-CM | POA: Insufficient documentation

## 2022-05-26 NOTE — Telephone Encounter (Signed)
Patient notified of results.

## 2022-05-26 NOTE — Telephone Encounter (Signed)
left voicemail to call back

## 2022-05-26 NOTE — Telephone Encounter (Signed)
Please advise pt that her lab work shows borderline diabetes and elevated cholesterol.  Please continue to work on healthy diet and regular exercise.

## 2022-05-30 ENCOUNTER — Encounter: Payer: Self-pay | Admitting: *Deleted

## 2022-06-02 ENCOUNTER — Encounter: Payer: Self-pay | Admitting: *Deleted

## 2022-06-28 ENCOUNTER — Encounter: Payer: 59 | Admitting: Family Medicine

## 2022-06-30 ENCOUNTER — Other Ambulatory Visit (HOSPITAL_BASED_OUTPATIENT_CLINIC_OR_DEPARTMENT_OTHER): Payer: Self-pay

## 2022-06-30 MED ORDER — COMIRNATY 30 MCG/0.3ML IM SUSY
PREFILLED_SYRINGE | INTRAMUSCULAR | 0 refills | Status: DC
  Filled 2022-06-30: qty 0.3, 1d supply, fill #0

## 2022-08-30 DIAGNOSIS — H524 Presbyopia: Secondary | ICD-10-CM | POA: Diagnosis not present

## 2022-08-30 DIAGNOSIS — H04123 Dry eye syndrome of bilateral lacrimal glands: Secondary | ICD-10-CM | POA: Diagnosis not present

## 2022-09-26 DIAGNOSIS — H04123 Dry eye syndrome of bilateral lacrimal glands: Secondary | ICD-10-CM | POA: Diagnosis not present

## 2022-10-10 DIAGNOSIS — H04123 Dry eye syndrome of bilateral lacrimal glands: Secondary | ICD-10-CM | POA: Diagnosis not present

## 2022-11-22 DIAGNOSIS — H04123 Dry eye syndrome of bilateral lacrimal glands: Secondary | ICD-10-CM | POA: Diagnosis not present

## 2022-12-05 ENCOUNTER — Other Ambulatory Visit (HOSPITAL_COMMUNITY): Payer: Self-pay

## 2022-12-05 ENCOUNTER — Ambulatory Visit: Payer: 59 | Admitting: Family Medicine

## 2022-12-05 ENCOUNTER — Encounter: Payer: Self-pay | Admitting: Family Medicine

## 2022-12-05 ENCOUNTER — Other Ambulatory Visit (HOSPITAL_BASED_OUTPATIENT_CLINIC_OR_DEPARTMENT_OTHER): Payer: Self-pay

## 2022-12-05 VITALS — BP 110/62 | HR 67 | Temp 99.0°F | Ht 65.0 in | Wt 148.2 lb

## 2022-12-05 DIAGNOSIS — B9689 Other specified bacterial agents as the cause of diseases classified elsewhere: Secondary | ICD-10-CM

## 2022-12-05 DIAGNOSIS — J208 Acute bronchitis due to other specified organisms: Secondary | ICD-10-CM | POA: Diagnosis not present

## 2022-12-05 DIAGNOSIS — J029 Acute pharyngitis, unspecified: Secondary | ICD-10-CM | POA: Diagnosis not present

## 2022-12-05 LAB — POCT RAPID STREP A (OFFICE): Rapid Strep A Screen: NEGATIVE

## 2022-12-05 LAB — POC COVID19 BINAXNOW: SARS Coronavirus 2 Ag: NEGATIVE

## 2022-12-05 MED ORDER — AZITHROMYCIN 250 MG PO TABS
ORAL_TABLET | ORAL | 0 refills | Status: DC
  Filled 2022-12-05 (×2): qty 6, 5d supply, fill #0

## 2022-12-05 MED ORDER — GUAIFENESIN-CODEINE 100-10 MG/5ML PO SOLN
5.0000 mL | Freq: Four times a day (QID) | ORAL | 0 refills | Status: DC | PRN
  Filled 2022-12-05: qty 120, 6d supply, fill #0

## 2022-12-05 NOTE — Progress Notes (Signed)
Subjective  CC:  Chief Complaint  Patient presents with   Sore Throat    Pt stated that she has been experiencing a sore throat for the past 2 days. Pt feels like it has gotten worse    HPI: SUBJECTIVE:  Julie Dorsey is a 57 y.o. female who complains of congestion, nasal blockage, post nasal drip, cough described as productive of green sputum and denies sinus, high fevers, SOB, chest pain or significant GI symptoms. She has a sore throat as well. Symptoms have been present for 2-3. Husband has similar illness and is treated with abx with quick improvement in sxs. She denies a history of anorexia, dizziness, vomiting and wheezing. She denies a history of asthma or COPD. Patient does not smoke cigarettes.  Assessment  1. Acute bacterial bronchitis   2. Sore throat      Plan  Discussion:  Treat for bacterial bronchitis although discussed possible viral etiology. Pt would like to use zpak. Cough medicines to help as well. Education regarding differences between viral and bacterial infections and treatment options are discussed.  Supportive care measures are recommended.  We discussed the use of mucolytic's, decongestants, antihistamines and antitussives as needed.  Tylenol or Advil are recommended if needed.  Follow up: prn   Orders Placed This Encounter  Procedures   Culture, Group A Strep   POCT rapid strep A   POC COVID-19   Meds ordered this encounter  Medications   azithromycin (ZITHROMAX) 250 MG tablet    Sig: Take 2 tabs today, then 1 tab daily for 4 days    Dispense:  6 tablet    Refill:  0   guaiFENesin-codeine 100-10 MG/5ML syrup    Sig: Take 5 mLs by mouth every 6 (six) hours as needed for cough.    Dispense:  120 mL    Refill:  0      I reviewed the patients updated PMH, FH, and SocHx.  Social History: Patient  reports that she has never smoked. She has never used smokeless tobacco. She reports current alcohol use. She reports that she does not use  drugs.  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   Hyperglycemia 11/18/2020   Insomnia 11/18/2020   Midline cystocele 11/18/2020   Mixed hyperlipidemia 11/18/2020   Pelvic congestion 11/18/2020   Personal history of colonic polyps 11/18/2020   Rectal bleeding 11/18/2020    Review of Systems: Cardiovascular: negative for chest pain Respiratory: negative for SOB or hemoptysis Gastrointestinal: negative for abdominal pain Genitourinary: negative for dysuria or gross hematuria Current Meds  Medication Sig   azithromycin (ZITHROMAX) 250 MG tablet Take 2 tabs today, then 1 tab daily for 4 days   guaiFENesin-codeine 100-10 MG/5ML syrup Take 5 mLs by mouth every 6 (six) hours as needed for cough.   Multiple Vitamin (MULTIVITAMIN) capsule Take 1 capsule by mouth daily.   [DISCONTINUED] COVID-19 mRNA vaccine 2023-2024 (COMIRNATY) syringe Inject into the muscle.    Objective  Vitals: BP 110/62   Pulse 67   Temp 99 F (37.2 C)   Ht 5\' 5"  (1.651 m)   Wt 148 lb 3.2 oz (67.2 kg)   SpO2 98%   BMI 24.66 kg/m  General: no acute distress  Psych:  Alert and oriented, normal mood and affect HEENT:  Normocephalic, atraumatic, supple neck, moist mucous membranes, mildly erythematous pharynx without exudate, mild lymphadenopathy, supple  neck Cardiovascular:  RRR without murmur. no edema Respiratory:  Good breath sounds bilaterally, CTAB with normal respiratory effort with occasional rhonchi Skin:  Warm, no rashes Neurologic:   Mental status is normal. normal gait  Commons side effects, risks, benefits, and alternatives for medications and treatment plan prescribed today were discussed, and the patient expressed understanding of the given instructions. Patient is instructed to call or message via MyChart if he/she has any questions or concerns regarding our treatment plan. No  barriers to understanding were identified. We discussed Red Flag symptoms and signs in detail. Patient expressed understanding regarding what to do in case of urgent or emergency type symptoms.  Medication list was reconciled, printed and provided to the patient in AVS. Patient instructions and summary information was reviewed with the patient as documented in the AVS. This note was prepared with assistance of Dragon voice recognition software. Occasional wrong-word or sound-a-like substitutions may have occurred due to the inherent limitations of voice recognition software

## 2022-12-05 NOTE — Patient Instructions (Signed)
Please follow up if symptoms do not improve or as needed.    Pharyngitis  Pharyngitis is inflammation of the throat (pharynx). It is a very common cause of sore throat. Pharyngitis can be caused by a bacteria, but it is usually caused by a virus. Most cases of pharyngitis get better on their own without treatment. What are the causes? This condition may be caused by: Infection by viruses (viral). Viral pharyngitis spreads easily from person to person (is contagious) through coughing, sneezing, and sharing of personal items or utensils such as cups, forks, spoons, and toothbrushes. Infection by bacteria (bacterial). Bacterial pharyngitis may be spread by touching the nose or face after coming in contact with the bacteria, or through close contact, such as kissing. Allergies. Allergies can cause buildup of mucus in the throat (post-nasal drip), leading to inflammation and irritation. Allergies can also cause blocked nasal passages, forcing breathing through the mouth, which dries and irritates the throat. What increases the risk? You are more likely to develop this condition if: You are 5-24 years old. You are exposed to crowded environments such as daycare, school, or dormitory living. You live in a cold climate. You have a weakened disease-fighting (immune) system. What are the signs or symptoms? Symptoms of this condition vary by the cause. Common symptoms of this condition include: Sore throat. Fatigue. Low-grade fever. Stuffy nose (nasal congestion) and cough. Headache. Other symptoms may include: Glands in the neck (lymph nodes) that are swollen. Skin rashes. Plaque-like film on the throat or tonsils. This is often a symptom of bacterial pharyngitis. Vomiting. Red, itchy eyes (conjunctivitis). Loss of appetite. Joint pain and muscle aches. Enlarged tonsils. How is this diagnosed? This condition may be diagnosed based on your medical history and a physical exam. Your health care  provider will ask you questions about your illness and your symptoms. A swab of your throat may be done to check for bacteria (rapid strep test). Other lab tests may also be done, depending on the suspected cause, but these are rare. How is this treated? Many times, treatment is not needed for this condition. Pharyngitis usually gets better in 3-4 days without treatment. Bacterial pharyngitis may be treated with antibiotic medicines. Follow these instructions at home: Medicines Take over-the-counter and prescription medicines only as told by your health care provider. If you were prescribed an antibiotic medicine, take it as told by your health care provider. Do not stop taking the antibiotic even if you start to feel better. Use throat sprays to soothe your throat as told by your health care provider. Children can get pharyngitis. Do not give your child aspirin because of the association with Reye's syndrome. Managing pain To help with pain, try: Sipping warm liquids, such as broth, herbal tea, or warm water. Eating or drinking cold or frozen liquids, such as frozen ice pops. Gargling with a mixture of salt and water 3-4 times a day or as needed. To make salt water, completely dissolve -1 tsp (3-6 g) of salt in 1 cup (237 mL) of warm water. Sucking on hard candy or throat lozenges. Putting a cool-mist humidifier in your bedroom at night to moisten the air. Sitting in the bathroom with the door closed for 5-10 minutes while you run hot water in the shower.  General instructions  Do not use any products that contain nicotine or tobacco. These products include cigarettes, chewing tobacco, and vaping devices, such as e-cigarettes. If you need help quitting, ask your health care provider. Rest as told   by your health care provider. Drink enough fluid to keep your urine pale yellow. How is this prevented? To help prevent becoming infected or spreading infection: Wash your hands often with soap  and water for at least 20 seconds. If soap and water are not available, use hand sanitizer. Do not touch your eyes, nose, or mouth with unwashed hands, and wash hands after touching these areas. Do not share cups or eating utensils. Avoid close contact with people who are sick. Contact a health care provider if: You have large, tender lumps in your neck. You have a rash. You cough up green, yellow-brown, or bloody mucus. Get help right away if: Your neck becomes stiff. You drool or are unable to swallow liquids. You cannot drink or take medicines without vomiting. You have severe pain that does not go away, even after you take medicine. You have trouble breathing, and it is not caused by a stuffy nose. You have new pain and swelling in your joints such as the knees, ankles, wrists, or elbows. These symptoms may represent a serious problem that is an emergency. Do not wait to see if the symptoms will go away. Get medical help right away. Call your local emergency services (911 in the [place] Do not drive yourself to the hospital. Summary Pharyngitis is redness, pain, and swelling (inflammation) of the throat (pharynx). While pharyngitis can be caused by a bacteria, the most common causes are viral. Most cases of pharyngitis get better on their own without treatment. Bacterial pharyngitis is treated with antibiotic medicines. This information is not intended to replace advice given to you by your health care provider. Make sure you discuss any questions you have with your health care provider. Document Revised: 11/04/2020 Document Reviewed: 11/04/2020 Elsevier Patient Education  2023 [employer]  

## 2022-12-07 LAB — CULTURE, GROUP A STREP
MICRO NUMBER:: 14825272
SPECIMEN QUALITY:: ADEQUATE

## 2022-12-15 ENCOUNTER — Other Ambulatory Visit (HOSPITAL_COMMUNITY): Payer: Self-pay

## 2023-01-05 ENCOUNTER — Ambulatory Visit (INDEPENDENT_AMBULATORY_CARE_PROVIDER_SITE_OTHER): Payer: 59 | Admitting: Obstetrics and Gynecology

## 2023-01-05 ENCOUNTER — Other Ambulatory Visit (HOSPITAL_COMMUNITY)
Admission: RE | Admit: 2023-01-05 | Discharge: 2023-01-05 | Disposition: A | Discharge status: Home / Self Care | Payer: 59 | Source: Ambulatory Visit | Attending: Obstetrics and Gynecology | Admitting: Obstetrics and Gynecology

## 2023-01-05 ENCOUNTER — Encounter: Payer: Self-pay | Admitting: Obstetrics and Gynecology

## 2023-01-05 VITALS — BP 110/68 | HR 60 | Resp 12 | Ht 64.75 in | Wt 145.0 lb

## 2023-01-05 DIAGNOSIS — Z124 Encounter for screening for malignant neoplasm of cervix: Secondary | ICD-10-CM | POA: Diagnosis not present

## 2023-01-05 DIAGNOSIS — Z01419 Encounter for gynecological examination (general) (routine) without abnormal findings: Secondary | ICD-10-CM | POA: Diagnosis not present

## 2023-01-05 NOTE — Patient Instructions (Addendum)
EXERCISE   We recommended that you start or continue a regular exercise program for good health. Physical activity is anything that gets your body moving, some is better than none. The CDC recommends 150 minutes per week of Moderate-Intensity Aerobic Activity and 2 or more days of Muscle Strengthening Activity.  Benefits of exercise are limitless: helps weight loss/weight maintenance, improves mood and energy, helps with depression and anxiety, improves sleep, tones and strengthens muscles, improves balance, improves bone density, protects from chronic conditions such as heart disease, high blood pressure and diabetes and so much more. To learn more visit: https://www.cdc.gov/physicalactivity/index.html  DIET: Good nutrition starts with a healthy diet of fruits, vegetables, whole grains, and lean protein sources. Drink plenty of water for hydration. Minimize empty calories, sodium, sweets. For more information about dietary recommendations visit: https://health.gov/our-work/nutrition-physical-activity/dietary-guidelines and https://www.myplate.gov/  ALCOHOL:  Women should limit their alcohol intake to no more than 7 drinks/beers/glasses of wine (combined, not each!) per week. Moderation of alcohol intake to this level decreases your risk of breast cancer and liver damage.  If you are concerned that you may have a problem, or your friends have told you they are concerned about your drinking, there are many resources to help. A well-known program that is free, effective, and available to all people all over the nation is Alcoholics Anonymous.  Check out this site to learn more: https://www.aa.org/   CALCIUM AND VITAMIN D:  Adequate intake of calcium and Vitamin D are recommended for bone health.  You should be getting between 1000-1200 mg of calcium and 800 units of Vitamin D daily between diet and supplements  PAP SMEARS:  Pap smears, to check for cervical cancer or precancers,  have traditionally been  done yearly, scientific advances have shown that most women can have pap smears less often.  However, every woman still should have a physical exam from her gynecologist every year. It will include a breast check, inspection of the vulva and vagina to check for abnormal growths or skin changes, a visual exam of the cervix, and then an exam to evaluate the size and shape of the uterus and ovaries. We will also provide age appropriate advice regarding health maintenance, like when you should have certain vaccines, screening for sexually transmitted diseases, bone density testing, colonoscopy, mammograms, etc.   MAMMOGRAMS:  All women over 40 years old should have a routine mammogram.   COLON CANCER SCREENING: Now recommend starting at age 45. At this time colonoscopy is not covered for routine screening until 50. There are take home tests that can be done between 45-49.   COLONOSCOPY:  Colonoscopy to screen for colon cancer is recommended for all women at age 50.  We know, you hate the idea of the prep.  We agree, BUT, having colon cancer and not knowing it is worse!!  Colon cancer so often starts as a polyp that can be seen and removed at colonscopy, which can quite literally save your life!  And if your first colonoscopy is normal and you have no family history of colon cancer, most women don't have to have it again for 10 years.  Once every ten years, you can do something that may end up saving your life, right?  We will be happy to help you get it scheduled when you are ready.  Be sure to check your insurance coverage so you understand how much it will cost.  It may be covered as a preventative service at no cost, but you should check   your particular policy.      Breast Self-Awareness Breast self-awareness means being familiar with how your breasts look and feel. It involves checking your breasts regularly and reporting any changes to your health care provider. Practicing breast self-awareness is  important. A change in your breasts can be a sign of a serious medical problem. Being familiar with how your breasts look and feel allows you to find any problems early, when treatment is more likely to be successful. All women should practice breast self-awareness, including women who have had breast implants. How to do a breast self-exam One way to learn what is normal for your breasts and whether your breasts are changing is to do a breast self-exam. To do a breast self-exam: Look for Changes  Remove all the clothing above your waist. Stand in front of a mirror in a room with good lighting. Put your hands on your hips. Push your hands firmly downward. Compare your breasts in the mirror. Look for differences between them (asymmetry), such as: Differences in shape. Differences in size. Puckers, dips, and bumps in one breast and not the other. Look at each breast for changes in your skin, such as: Redness. Scaly areas. Look for changes in your nipples, such as: Discharge. Bleeding. Dimpling. Redness. A change in position. Feel for Changes Carefully feel your breasts for lumps and changes. It is best to do this while lying on your back on the floor and again while sitting or standing in the shower or tub with soapy water on your skin. Feel each breast in the following way: Place the arm on the side of the breast you are examining above your head. Feel your breast with the other hand. Start in the nipple area and make  inch (2 cm) overlapping circles to feel your breast. Use the pads of your three middle fingers to do this. Apply light pressure, then medium pressure, then firm pressure. The light pressure will allow you to feel the tissue closest to the skin. The medium pressure will allow you to feel the tissue that is a little deeper. The firm pressure will allow you to feel the tissue close to the ribs. Continue the overlapping circles, moving downward over the breast until you feel your  ribs below your breast. Move one finger-width toward the center of the body. Continue to use the  inch (2 cm) overlapping circles to feel your breast as you move slowly up toward your collarbone. Continue the up and down exam using all three pressures until you reach your armpit.  Write Down What You Find  Write down what is normal for each breast and any changes that you find. Keep a written record with breast changes or normal findings for each breast. By writing this information down, you do not need to depend only on memory for size, tenderness, or location. Write down where you are in your menstrual cycle, if you are still menstruating. If you are having trouble noticing differences in your breasts, do not get discouraged. With time you will become more familiar with the variations in your breasts and more comfortable with the exam. How often should I examine my breasts? Examine your breasts every month. If you are breastfeeding, the best time to examine your breasts is after a feeding or after using a breast pump. If you menstruate, the best time to examine your breasts is 5-7 days after your period is over. During your period, your breasts are lumpier, and it may be more   difficult to notice changes. When should I see my health care provider? See your health care provider if you notice: A change in shape or size of your breasts or nipples. A change in the skin of your breast or nipples, such as a reddened or scaly area. Unusual discharge from your nipples. A lump or thick area that was not there before. Pain in your breasts. Anything that concerns you. Kegel Exercises  Kegel exercises can help strengthen your pelvic floor muscles. The pelvic floor is a group of muscles that support your rectum, small intestine, and bladder. In females, pelvic floor muscles also help support the uterus. These muscles help you control the flow of urine and stool (feces). Kegel exercises are painless and  simple. They do not require any equipment. Your provider may suggest Kegel exercises to: Improve bladder and bowel control. Improve sexual response. Improve weak pelvic floor muscles after surgery to remove the uterus (hysterectomy) or after pregnancy, in females. Improve weak pelvic floor muscles after prostate gland removal or surgery, in males. Kegel exercises involve squeezing your pelvic floor muscles. These are the same muscles you squeeze when you try to stop the flow of urine or keep from passing gas. The exercises can be done while sitting, standing, or lying down, but it is best to vary your position. Ask your health care provider which exercises are safe for you. Do exercises exactly as told by your health care provider and adjust them as directed. Do not begin these exercises until told by your health care provider. Exercises How to do Kegel exercises: Squeeze your pelvic floor muscles tight. You should feel a tight lift in your rectal area. If you are a female, you should also feel a tightness in your vaginal area. Keep your stomach, buttocks, and legs relaxed. Hold the muscles tight for up to 10 seconds. Breathe normally. Relax your muscles for up to 10 seconds. Repeat as told by your health care provider. Repeat this exercise daily as told by your health care provider. Continue to do this exercise for at least 4-6 weeks, or for as long as told by your health care provider. You may be referred to a physical therapist who can help you learn more about how to do Kegel exercises. Depending on your condition, your health care provider may recommend: Varying how long you squeeze your muscles. Doing several sets of exercises every day. Doing exercises for several weeks. Making Kegel exercises a part of your regular exercise routine. This information is not intended to replace advice given to you by your health care provider. Make sure you discuss any questions you have with your health  care provider. Document Revised: 12/17/2020 Document Reviewed: 12/17/2020 Elsevier Patient Education  2023 Elsevier Inc.  

## 2023-01-05 NOTE — Progress Notes (Signed)
57 y.o. [id number] Married White or Caucasian Not Hispanic or Latino female here for annual exam.  No vaginal bleeding. Sexually active, some dryness, helped with lubrication. No significant vasomotor symptoms.   No bowel or bladder c/o.    No LMP recorded. Patient is postmenopausal.          Sexually active: Yes.    The current method of family planning is post menopausal status.    Exercising: Yes.     Running, strength training  Smoker:  no  Health Maintenance: Pap:  10-24-2018 negative, HR HPV negative  History of abnormal Pap:  no MMG:  02-03-22 density B/BIRADS 1 negative  BMD:   none Colonoscopy: 10-15-19, f/u in 10 years.  TDaP:  2020  Gardasil: n/a   reports that she has never smoked. She has never used smokeless tobacco. She reports current alcohol use. She reports that she does not use drugs. She is an Charity fundraiser, works in Engineer, agricultural. 2 kids, one granddaughter.   Past Medical History:  Diagnosis Date   Hx of retinal hemorrhage    right eye    Past Surgical History:  Procedure Laterality Date   DIGIT NAIL REMOVAL Right 04/2017   right toe nail removed   WISDOM TOOTH EXTRACTION      Current Outpatient Medications  Medication Sig Dispense Refill   Multiple Vitamin (MULTIVITAMIN) capsule Take 1 capsule by mouth daily.     No current facility-administered medications for this visit.    Family History  Problem Relation Age of Onset   Hypertension Mother    Breast cancer Mother 42   Macular degeneration Mother    Glaucoma Mother    Hypertension Father    Cancer Father 68       ureter cancer   Hearing loss Maternal Grandmother    Heart disease Maternal Grandmother    Heart disease Maternal Grandfather    Hearing loss Paternal Grandmother    Heart disease Paternal Grandmother    Stroke Paternal Grandfather    Diabetes Paternal Grandfather    Heart disease Paternal Grandfather     Review of Systems  All other systems reviewed and are negative.   Exam:   BP  110/68   Pulse 60   Resp 12   Ht 5' 4.75" (1.645 m)   Wt 145 lb (65.8 kg)   BMI 24.32 kg/m   Weight change: @WEIGHTCHANGE @ Height:   Height: 5' 4.75" (164.5 cm)  Ht Readings from Last 3 Encounters:  01/05/23 5' 4.75" (1.645 m)  12/05/22 5\' 5"  (1.651 m)  05/24/22 5\' 5"  (1.651 m)    General appearance: alert, cooperative and appears stated age Head: Normocephalic, without obvious abnormality, atraumatic Neck: no adenopathy, supple, symmetrical, trachea midline and thyroid normal to inspection and palpation Lungs: clear to auscultation bilaterally Cardiovascular: regular rate and rhythm Breasts: normal appearance, no masses or tenderness Abdomen: soft, non-tender; non distended,  no masses,  no organomegaly Extremities: extremities normal, atraumatic, no cyanosis or edema Skin: Skin color, texture, turgor normal. No rashes or lesions Lymph nodes: Cervical, supraclavicular, and axillary nodes normal. No abnormal inguinal nodes palpated Neurologic: Grossly normal   Pelvic: External genitalia:  no lesions              Urethra:  normal appearing urethra with no masses, tenderness or lesions              Bartholins and Skenes: normal  Vagina: atrophic appearing vagina with a small grade 1-2 cystocele and rectocele (not symptomatic)              Cervix: no lesions               Bimanual Exam:  Uterus:  normal size, contour, position, consistency, mobility, non-tender              Adnexa: no mass, fullness, tenderness               Rectovaginal: Confirms               Anus:  normal sphincter tone, no lesions  Arnold Long, RN chaperoned for the exam.  1. Well woman exam Discussed breast self exam Discussed calcium and vit D intake Labs with primary Mammogram next month Colonoscopy UTD  2. Screening for cervical cancer - Cytology - PAP  Addendum: she has mild genital prolapse, not symptomatic. Discussed avoiding heavy lifting and straining. Encouraged kegels.

## 2023-01-06 LAB — CYTOLOGY - PAP
Comment: NEGATIVE
Diagnosis: NEGATIVE
High risk HPV: NEGATIVE

## 2023-03-22 ENCOUNTER — Other Ambulatory Visit: Payer: Self-pay | Admitting: Family Medicine

## 2023-03-22 DIAGNOSIS — Z1231 Encounter for screening mammogram for malignant neoplasm of breast: Secondary | ICD-10-CM

## 2023-03-31 DIAGNOSIS — L570 Actinic keratosis: Secondary | ICD-10-CM | POA: Diagnosis not present

## 2023-03-31 DIAGNOSIS — L814 Other melanin hyperpigmentation: Secondary | ICD-10-CM | POA: Diagnosis not present

## 2023-03-31 DIAGNOSIS — D225 Melanocytic nevi of trunk: Secondary | ICD-10-CM | POA: Diagnosis not present

## 2023-03-31 DIAGNOSIS — L821 Other seborrheic keratosis: Secondary | ICD-10-CM | POA: Diagnosis not present

## 2023-04-04 ENCOUNTER — Ambulatory Visit: Admission: RE | Admit: 2023-04-04 | Payer: 59 | Source: Ambulatory Visit

## 2023-04-04 DIAGNOSIS — Z1231 Encounter for screening mammogram for malignant neoplasm of breast: Secondary | ICD-10-CM | POA: Diagnosis not present

## 2023-05-26 ENCOUNTER — Ambulatory Visit (INDEPENDENT_AMBULATORY_CARE_PROVIDER_SITE_OTHER): Payer: 59 | Admitting: Family Medicine

## 2023-05-26 ENCOUNTER — Other Ambulatory Visit (HOSPITAL_BASED_OUTPATIENT_CLINIC_OR_DEPARTMENT_OTHER): Payer: Self-pay

## 2023-05-26 ENCOUNTER — Encounter: Payer: Self-pay | Admitting: Family Medicine

## 2023-05-26 VITALS — BP 125/68 | HR 51 | Temp 97.9°F | Resp 18 | Ht 65.1 in | Wt 147.4 lb

## 2023-05-26 DIAGNOSIS — G4700 Insomnia, unspecified: Secondary | ICD-10-CM | POA: Diagnosis not present

## 2023-05-26 DIAGNOSIS — R739 Hyperglycemia, unspecified: Secondary | ICD-10-CM

## 2023-05-26 DIAGNOSIS — Z0000 Encounter for general adult medical examination without abnormal findings: Secondary | ICD-10-CM | POA: Diagnosis not present

## 2023-05-26 LAB — LIPID PANEL
Cholesterol: 239 mg/dL — ABNORMAL HIGH (ref 0–200)
HDL: 102.8 mg/dL (ref >39.00)
LDL Cholesterol: 123 mg/dL — ABNORMAL HIGH (ref 0–99)
NonHDL: 135.83
Total CHOL/HDL Ratio: 2
Triglycerides: 62 mg/dL (ref 0.0–149.0)
VLDL: 12.4 mg/dL (ref 0.0–40.0)

## 2023-05-26 LAB — HEMOGLOBIN A1C: Hgb A1c MFr Bld: 5.8 % (ref 4.6–6.5)

## 2023-05-26 LAB — CBC WITH DIFFERENTIAL/PLATELET
Basophils Absolute: 0 10*3/uL (ref 0.0–0.1)
Basophils Relative: 1 % (ref 0.0–3.0)
Eosinophils Absolute: 0.2 10*3/uL (ref 0.0–0.7)
Eosinophils Relative: 4.6 % (ref 0.0–5.0)
HCT: 41.1 % (ref 36.0–46.0)
Hemoglobin: 13.4 g/dL (ref 12.0–15.0)
Lymphocytes Relative: 38.2 % (ref 12.0–46.0)
Lymphs Abs: 1.6 10*3/uL (ref 0.7–4.0)
MCHC: 32.5 g/dL (ref 30.0–36.0)
MCV: 95.4 fL (ref 78.0–100.0)
Monocytes Absolute: 0.4 10*3/uL (ref 0.1–1.0)
Monocytes Relative: 10.5 % (ref 3.0–12.0)
Neutro Abs: 1.9 10*3/uL (ref 1.4–7.7)
Neutrophils Relative %: 45.7 % (ref 43.0–77.0)
Platelets: 244 10*3/uL (ref 150.0–400.0)
RBC: 4.31 Mil/uL (ref 3.87–5.11)
RDW: 13.4 % (ref 11.5–15.5)
WBC: 4.2 10*3/uL (ref 4.0–10.5)

## 2023-05-26 LAB — COMPREHENSIVE METABOLIC PANEL
ALT: 25 U/L (ref 0–35)
AST: 31 U/L (ref 0–37)
Albumin: 4.3 g/dL (ref 3.5–5.2)
Alkaline Phosphatase: 51 U/L (ref 39–117)
BUN: 14 mg/dL (ref 6–23)
CO2: 28 meq/L (ref 19–32)
Calcium: 9.4 mg/dL (ref 8.4–10.5)
Chloride: 104 meq/L (ref 96–112)
Creatinine, Ser: 0.71 mg/dL (ref 0.40–1.20)
GFR: 93.52 mL/min (ref >60.00)
Glucose, Bld: 86 mg/dL (ref 70–99)
Potassium: 4.5 meq/L (ref 3.5–5.1)
Sodium: 140 meq/L (ref 135–145)
Total Bilirubin: 0.9 mg/dL (ref 0.2–1.2)
Total Protein: 6.4 g/dL (ref 6.0–8.3)

## 2023-05-26 LAB — TSH: TSH: 1.54 u[IU]/mL (ref 0.35–5.50)

## 2023-05-26 MED ORDER — ZOLPIDEM TARTRATE 5 MG PO TABS
5.0000 mg | ORAL_TABLET | Freq: Every evening | ORAL | 0 refills | Status: DC | PRN
  Filled 2023-05-26: qty 30, 30d supply, fill #0

## 2023-05-26 NOTE — Progress Notes (Signed)
Complete physical exam  Patient: Julie Dorsey   DOB: 1966/04/27   57 y.o. Female  MRN: [id number]  Subjective:    Chief Complaint  Patient presents with   Annual Exam    Concerns/ questions: none    Julie Dorsey is a 57 y.o. female who presents today for a complete physical exam. She reports consuming a general diet. Home exercise routine includes daily walking, weight bearing exercises. She generally feels well. She reports sleeping fairly well. She does not have additional problems to discuss today.   Currently lives with: husband Acute concerns or interim problems since last visit: no, she needs a refill on Ambien, using rarely  Vision concerns: no, follows with [facility] Dental concerns: no, regular dental care    ETOH use: rare Nicotine use: no Recreational drugs/illegal substances: no      Most recent fall risk assessment:    05/26/2023    8:19 AM  Fall Risk   Falls in the past year? 0  Number falls in past yr: 0  Injury with Fall? 0  Risk for fall due to : No Fall Risks  Follow up Falls evaluation completed     Most recent depression screenings:    05/26/2023    8:19 AM 12/05/2022    2:43 PM  PHQ 2/9 Scores  PHQ - 2 Score 0 0            Patient Care Team: Clayborne Dana, NP as PCP - General (Family Medicine) Clark-Burning, Victorino Dike, PA-C (Inactive) (Dermatology)   Outpatient Medications Prior to Visit  Medication Sig   diphenhydramine-acetaminophen (TYLENOL PM) 25-500 MG TABS tablet Take 1 tablet by mouth at bedtime as needed.   Multiple Vitamin (MULTIVITAMIN) capsule Take 1 capsule by mouth daily.   TURMERIC PO Take by mouth.   No facility-administered medications prior to visit.    ROS All review of systems negative except what is listed in the HPI        Objective:     BP 125/68 (BP Location: Right Arm, Patient Position: Sitting, Cuff Size: Normal)   Pulse (!) 51   Temp 97.9 F (36.6 C) (Temporal)   Resp 18    Ht 5' 5.1" (1.654 m)   Wt 147 lb 6.4 oz (66.9 kg)   SpO2 98%   BMI 24.45 kg/m    Physical Exam Vitals reviewed.  Constitutional:      General: She is not in acute distress.    Appearance: Normal appearance. She is not ill-appearing.  HENT:     Head: Normocephalic and atraumatic.     Right Ear: Tympanic membrane normal.     Left Ear: Tympanic membrane normal.     Nose: Nose normal.     Mouth/Throat:     Mouth: Mucous membranes are moist.     Pharynx: Oropharynx is clear.  Eyes:     Extraocular Movements: Extraocular movements intact.     Conjunctiva/sclera: Conjunctivae normal.     Pupils: Pupils are equal, round, and reactive to light.  Cardiovascular:     Rate and Rhythm: Normal rate and regular rhythm.     Pulses: Normal pulses.     Heart sounds: Normal heart sounds.  Pulmonary:     Effort: Pulmonary effort is normal.     Breath sounds: Normal breath sounds.  Abdominal:     General: Abdomen is flat. Bowel sounds are normal. There is no distension.     Palpations: Abdomen is soft. There is  no mass.     Tenderness: There is no abdominal tenderness. There is no right CVA tenderness, left CVA tenderness, guarding or rebound.  Genitourinary:    Comments: Deferred exam Musculoskeletal:        General: Normal range of motion.     Cervical back: Normal range of motion and neck supple. No tenderness.     Right lower leg: No edema.     Left lower leg: No edema.  Lymphadenopathy:     Cervical: No cervical adenopathy.  Skin:    General: Skin is warm and dry.     Capillary Refill: Capillary refill takes less than 2 seconds.  Neurological:     General: No focal deficit present.     Mental Status: She is alert and oriented to person, place, and time. Mental status is at baseline.  Psychiatric:        Mood and Affect: Mood normal.        Behavior: Behavior normal.        Thought Content: Thought content normal.        Judgment: Judgment normal.      No results found for  any visits on 05/26/23.     Assessment & Plan:    Routine Health Maintenance and Physical Exam Discussed health promotion and safety including diet and exercise recommendations, dental health, and injury prevention. Tobacco cessation if applicable. Seat belts, sunscreen, smoke detectors, etc.    Immunization History  Administered Date(s) Administered   Influenza Split 05/25/2015, 05/23/2016   Influenza-Unspecified 05/22/2021   PFIZER(Purple Top)SARS-COV-2 Vaccination 09/06/2019, 09/27/2019, 06/11/2020   Pfizer Covid-19 Vaccine Bivalent Booster 74yrs & up 06/15/2021   Pfizer(Comirnaty)Fall Seasonal Vaccine 12 years and older 06/30/2022   Tdap 11/05/2018   Zoster Recombinant(Shingrix) 01/03/2021, 07/06/2021    Health Maintenance  Topic Date Due   INFLUENZA VACCINE  11/20/2023 (Originally 03/23/2023)   MAMMOGRAM  04/03/2025   Cervical Cancer Screening (HPV/Pap Cotest)  01/05/2028   DTaP/Tdap/Td (2 - Td or Tdap) 11/04/2028   Colonoscopy  10/14/2029   COVID-19 Vaccine  Completed   Zoster Vaccines- Shingrix  Completed   HPV VACCINES  Aged Out   Hepatitis C Screening  Discontinued   HIV Screening  Discontinued        Problem List Items Addressed This Visit       Active Problems   Hyperglycemia   Relevant Orders   Hemoglobin A1c   Insomnia   Relevant Medications   zolpidem (AMBIEN) 5 MG tablet   Other Visit Diagnoses     Annual physical exam    -  Primary   Relevant Orders   CBC with Differential/Platelet   Comprehensive metabolic panel   Lipid panel   TSH   Hemoglobin A1c      Return in about 1 year (around 05/25/2024) for physical.     Clayborne Dana, NP

## 2023-05-26 NOTE — Progress Notes (Signed)
Total cholesterol and LDL (bad cholesterol) is mildly elevated, but your good (HDL) cholesterol is very good! You are very low risk overall, so we would typically just recommend you continue healthy lifestyle measures.  Other labs look good! A1c has come down some - keep up the good work!  Lifestyle factors for lowering cholesterol include: Diet therapy - heart-healthy diet rich in fruits, veggies, fiber-rich whole grains, lean meats, chicken, fish (at least twice a week), fat-free or 1% dairy products; foods low in saturated/trans fats, cholesterol, sodium, and sugar. Mediterranean diet has shown to be very heart healthy. Regular exercise - recommend at least 30 minutes a day, 5 times per week Weight management

## 2023-07-18 ENCOUNTER — Other Ambulatory Visit (HOSPITAL_BASED_OUTPATIENT_CLINIC_OR_DEPARTMENT_OTHER): Payer: Self-pay

## 2023-07-18 MED ORDER — COMIRNATY 30 MCG/0.3ML IM SUSY
0.3000 mL | PREFILLED_SYRINGE | Freq: Once | INTRAMUSCULAR | 0 refills | Status: AC
  Filled 2023-07-18: qty 0.3, 1d supply, fill #0

## 2023-10-19 ENCOUNTER — Other Ambulatory Visit (HOSPITAL_BASED_OUTPATIENT_CLINIC_OR_DEPARTMENT_OTHER): Payer: Self-pay

## 2023-10-19 MED ORDER — CEPHALEXIN 500 MG PO CAPS
500.0000 mg | ORAL_CAPSULE | Freq: Four times a day (QID) | ORAL | 0 refills | Status: AC
  Filled 2023-10-19: qty 28, 7d supply, fill #0

## 2023-11-03 IMAGING — MG MM DIGITAL SCREENING BILAT W/ TOMO AND CAD
6 of 12 series · 6 of 36 positions shown · non-contrast
Comparison: Previous exam(s).

CLINICAL DATA: Screening.

EXAM:
DIGITAL SCREENING BILATERAL MAMMOGRAM WITH TOMOSYNTHESIS AND CAD
TECHNIQUE: Bilateral screening digital craniocaudal and mediolateral oblique
mammograms were obtained. Bilateral screening digital breast
tomosynthesis was performed. The images were evaluated with
computer-aided detection.

[R XCCL synth-2D]
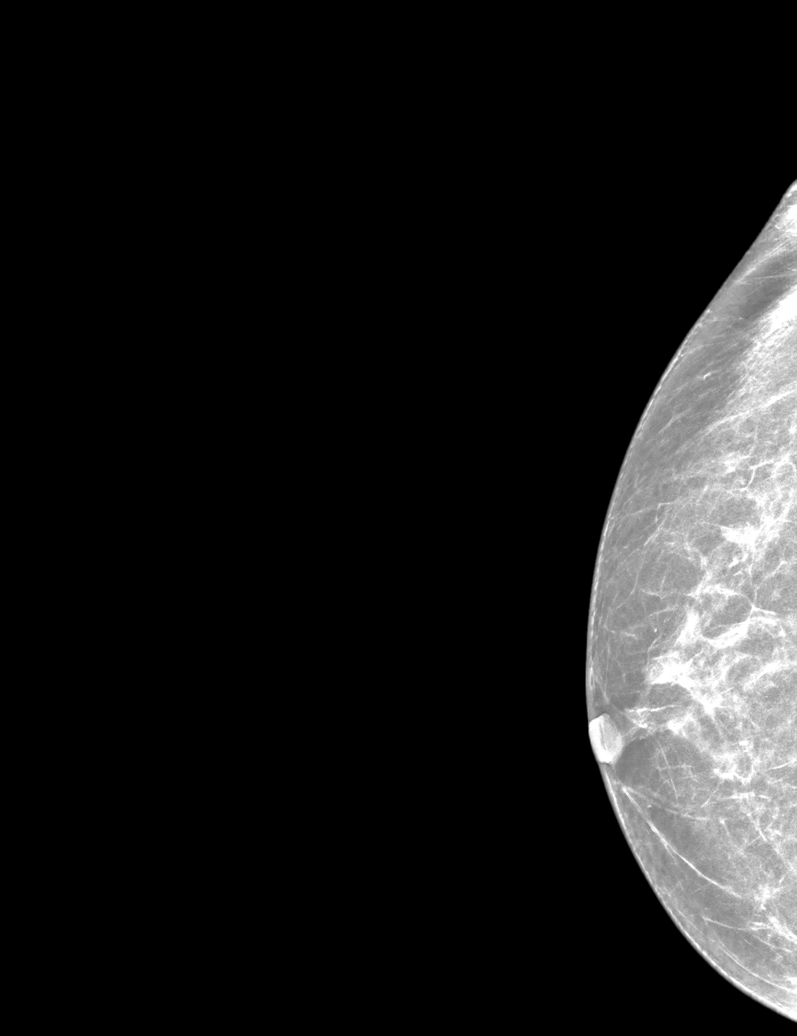

[L MLO synth-2D (1 of 2)]
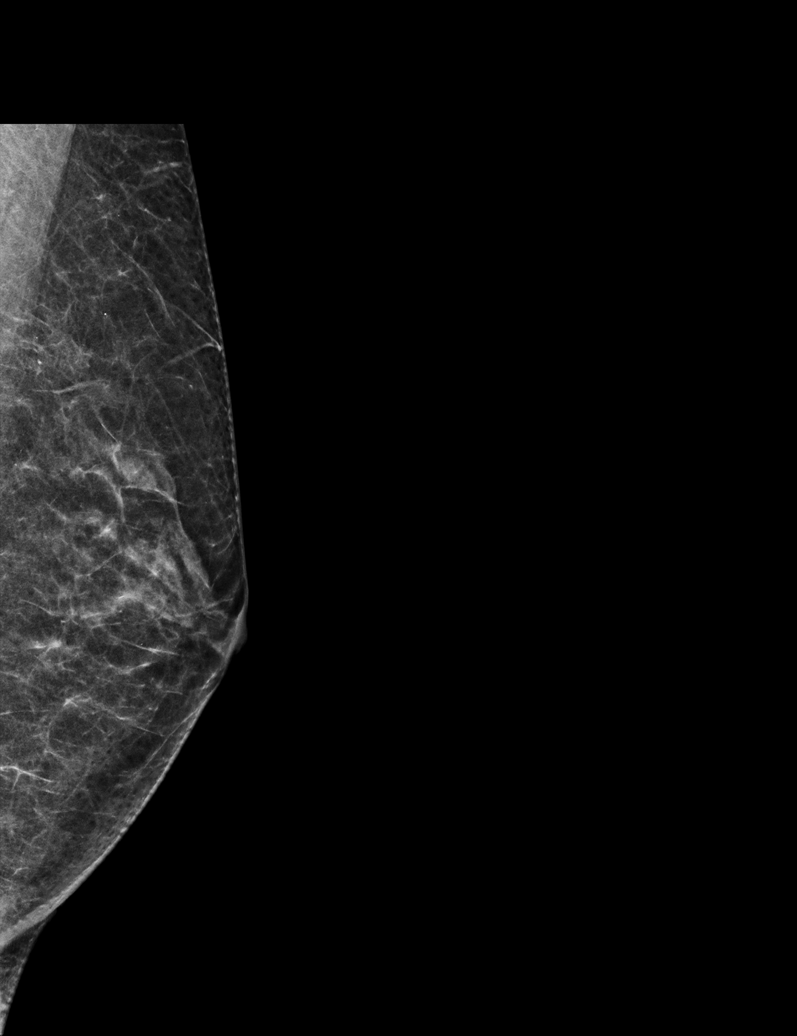

[L CC synth-2D]
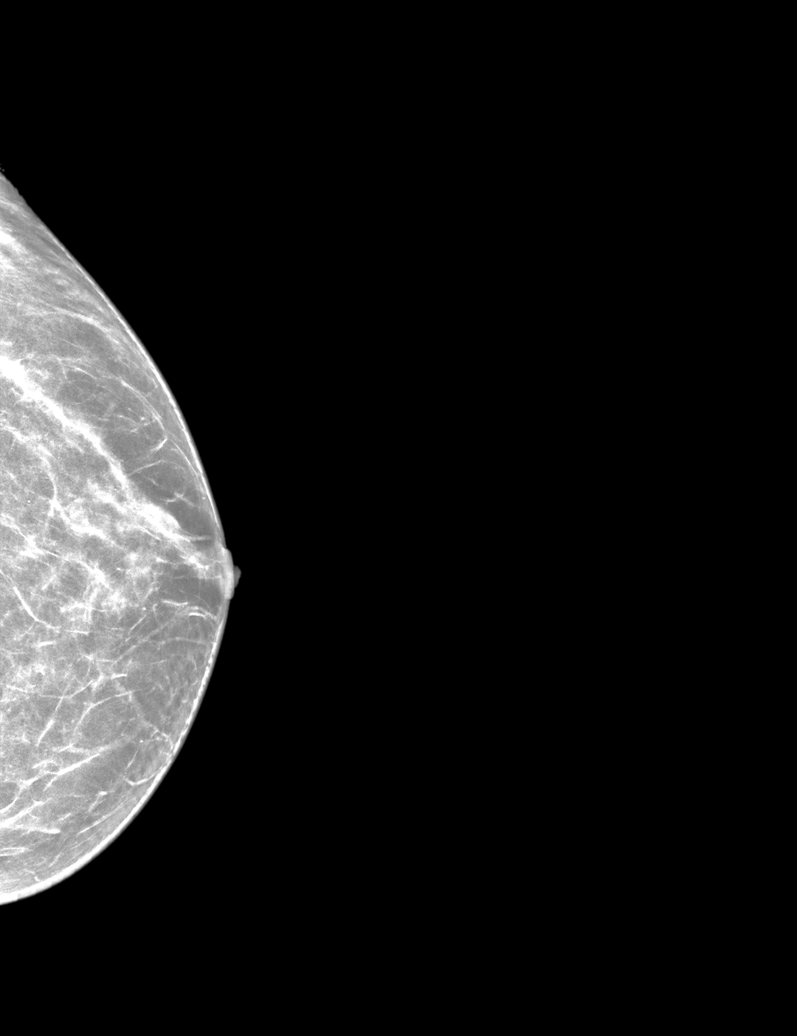

[R MLO synth-2D]
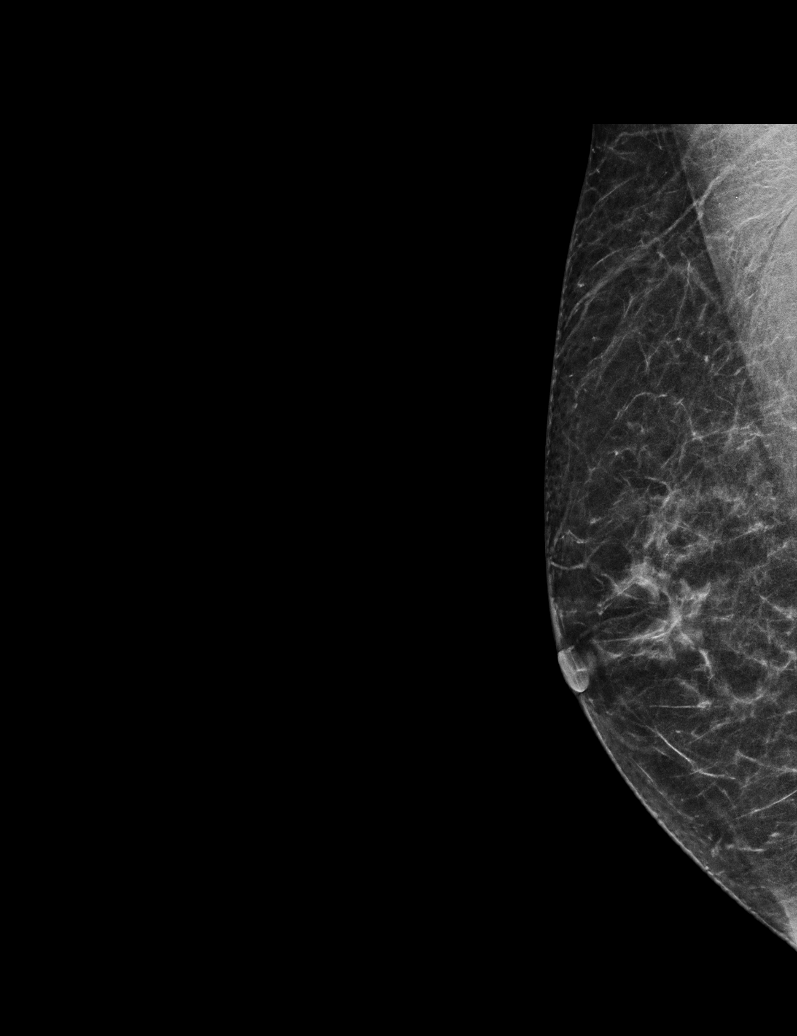

[R CC synth-2D]
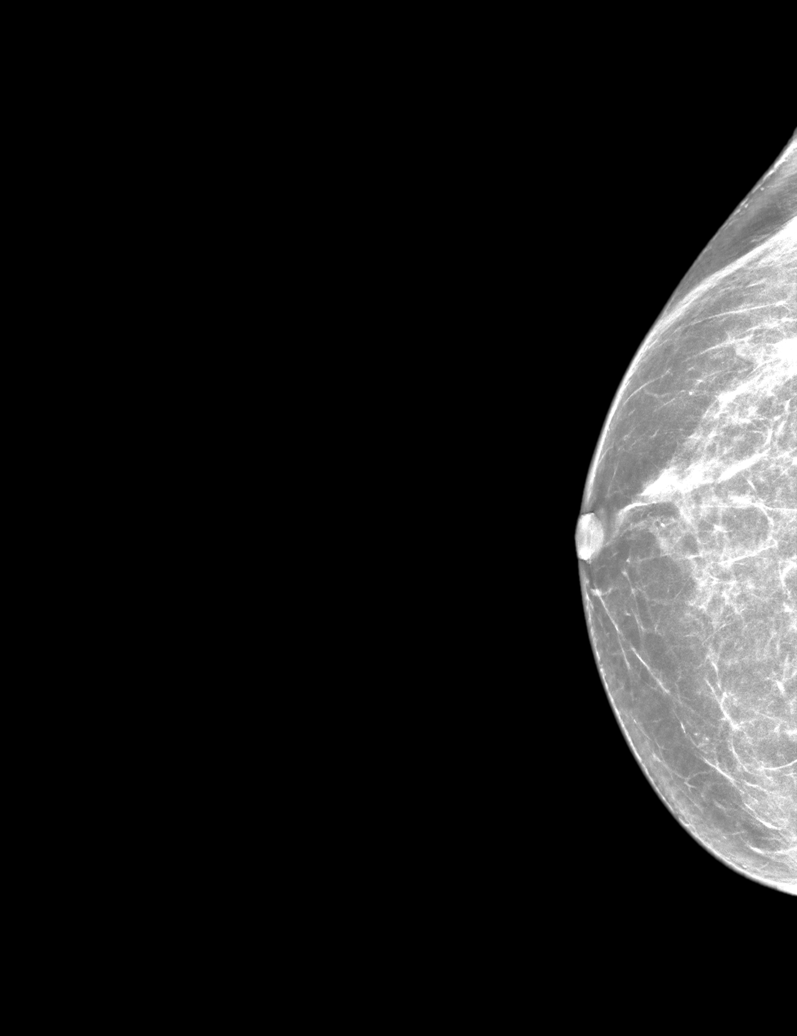

[L MLO synth-2D (2 of 2)]
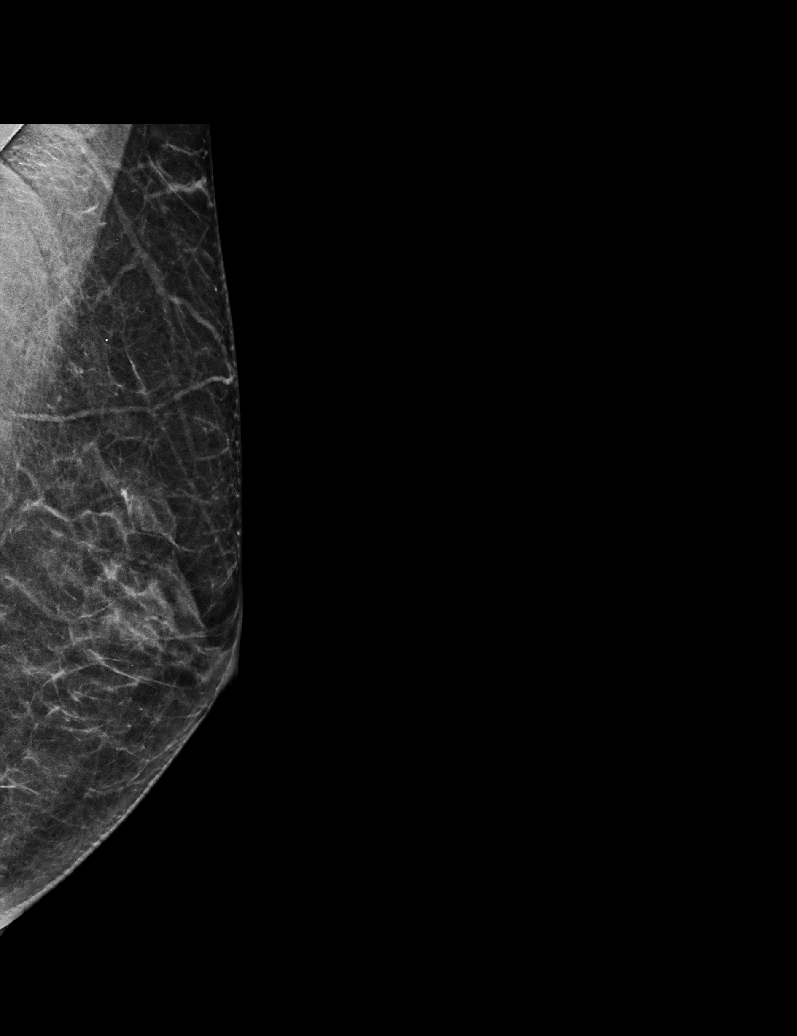

[6 of 36 positions shown; findings below may reference images not displayed]

ACR Breast Density Category b: There are scattered areas of
fibroglandular density.
FINDINGS: There are no findings suspicious for malignancy.
IMPRESSION: No mammographic evidence of malignancy. A result letter of this
screening mammogram will be mailed directly to the patient.

RECOMMENDATION:
Screening mammogram in one year. (Code:51-O-LD2)

BI-RADS CATEGORY  1: Negative.

## 2023-12-11 DIAGNOSIS — H524 Presbyopia: Secondary | ICD-10-CM | POA: Diagnosis not present

## 2024-02-15 ENCOUNTER — Other Ambulatory Visit (HOSPITAL_BASED_OUTPATIENT_CLINIC_OR_DEPARTMENT_OTHER): Payer: Self-pay

## 2024-02-15 MED ORDER — CEPHALEXIN 500 MG PO CAPS
500.0000 mg | ORAL_CAPSULE | Freq: Four times a day (QID) | ORAL | 0 refills | Status: AC
  Filled 2024-02-15: qty 28, 7d supply, fill #0

## 2024-02-26 ENCOUNTER — Other Ambulatory Visit: Payer: Self-pay | Admitting: Family Medicine

## 2024-02-26 DIAGNOSIS — Z1231 Encounter for screening mammogram for malignant neoplasm of breast: Secondary | ICD-10-CM

## 2024-02-28 ENCOUNTER — Other Ambulatory Visit: Payer: Self-pay | Admitting: Student in an Organized Health Care Education/Training Program

## 2024-02-28 DIAGNOSIS — Z1231 Encounter for screening mammogram for malignant neoplasm of breast: Secondary | ICD-10-CM

## 2024-03-04 ENCOUNTER — Encounter (HOSPITAL_BASED_OUTPATIENT_CLINIC_OR_DEPARTMENT_OTHER): Payer: Self-pay | Admitting: Obstetrics & Gynecology

## 2024-03-04 ENCOUNTER — Ambulatory Visit (HOSPITAL_BASED_OUTPATIENT_CLINIC_OR_DEPARTMENT_OTHER): Admitting: Obstetrics & Gynecology

## 2024-03-04 VITALS — BP 110/69 | HR 51 | Wt 143.4 lb

## 2024-03-04 DIAGNOSIS — Z803 Family history of malignant neoplasm of breast: Secondary | ICD-10-CM | POA: Diagnosis not present

## 2024-03-04 DIAGNOSIS — Z01411 Encounter for gynecological examination (general) (routine) with abnormal findings: Secondary | ICD-10-CM

## 2024-03-04 DIAGNOSIS — Z780 Asymptomatic menopausal state: Secondary | ICD-10-CM

## 2024-03-04 DIAGNOSIS — N8111 Cystocele, midline: Secondary | ICD-10-CM

## 2024-03-04 DIAGNOSIS — Z01419 Encounter for gynecological examination (general) (routine) without abnormal findings: Secondary | ICD-10-CM

## 2024-03-04 NOTE — Progress Notes (Signed)
 ANNUAL EXAM Patient name: Julie Dorsey MRN [id number]  Date of birth: [birth year] Chief Complaint:   Establish Care  History of Present Illness:   Julie Dorsey is a 58 y.o. [phone] Caucasian female being seen today for a routine annual exam.  Her mother had breast cancer in her late 25's.  She had negative genetic testing.  Tyrer Cusick model done today and pt's lifetime risk is 9.3%.   She is a distance runner and typically runs about 5 miles three times weekly.    No LMP recorded. Patient is postmenopausal.   Last pap 01/05/2023. Results were: NILM w/ HRHPV negative. H/O abnormal pap: no Last mammogram: 04/04/2023. Results were: normal. Family h/o breast cancer: yes mother.  Last colonoscopy: 10/15/2019. Results were: normal. Family h/o colorectal cancer: no     05/26/2023    8:19 AM 12/05/2022    2:43 PM 05/24/2022    8:55 AM  Depression screen PHQ 2/9  Decreased Interest 0 0 0  Down, Depressed, Hopeless 0 0 0  PHQ - 2 Score 0 0 0    Review of Systems:   Pertinent items are noted in HPI Denies any vaginal bleeding, pelvic pain, bladder or bowel issues.   Pertinent History Reviewed:  Reviewed past medical,surgical, social and family history.  Reviewed problem list, medications and allergies. Physical Assessment:   Vitals:   03/04/24 1610  BP: 110/69  Pulse: (!) 51  SpO2: 100%  Weight: 143 lb 6.4 oz (65 kg)  Body mass index is 23.79 kg/m.        Physical Examination:   General appearance - well appearing, and in no distress  Mental status - alert, oriented to person, place, and time  Psych:  She has a normal mood and affect  Skin - warm and dry, normal color, no suspicious lesions noted  Chest - effort normal, all lung fields clear to auscultation bilaterally  Heart - normal rate and regular rhythm  Neck:  midline trachea, no thyromegaly or nodules  Breasts - breasts appear normal, no suspicious masses, no skin or nipple changes or  axillary nodes  Abdomen - soft,  nontender, nondistended, no masses or organomegaly  Pelvic - VULVA: normal appearing vulva with no masses, tenderness or lesions   VAGINA: normal appearing vagina with normal color and discharge, no lesions  CERVIX: normal appearing cervix without discharge or lesions, no CMT  Thin prep pap is not done   UTERUS: uterus is felt to be normal size, shape, consistency and nontender   ADNEXA: No adnexal masses or tenderness noted.  Rectal - normal rectal, good sphincter tone, no masses felt.  Extremities:  No swelling or varicosities noted  Chaperone present for exam  Assessment & Plan:  1. Well woman exam with routine gynecological exam (Primary) - Pap smear neg 2024 - Mammogram scheduled 03/2024 - Colonoscopy 2021.  Follow up 10 years.   - Bone mineral density guidelines reviewed.  Plan to consider after next birthday.  - lab work done 05/2023 - vaccines reviewed/updated  2. Midline cystocele - discussed findings and topical OTC options for treatment  3. Postmenopausal -not on HRT   4. Family history of breast cancer - Tyrer Cusick model completed today.  Lifetime risk is 9.3%.  Breast MRI not indicated.     No orders of the defined types were placed in this encounter.   Meds: No orders of the defined types were placed in this encounter.   Follow-up: Return in about 1  year (around 03/04/2025).  Ronal GORMAN Pinal, MD 03/04/2024 4:55 PM

## 2024-03-04 NOTE — Patient Instructions (Signed)
Replense vaginal moisturizer.  OTC. Vit E vaginal suppositories.  Two to three times weekly.  [employer].  I can compound this with [facility].   Revaree Hyaluronic acid vaginal suppositories.   Vit E and Hyaluronic acid Gynetrof on [employer]

## 2024-04-08 ENCOUNTER — Other Ambulatory Visit (HOSPITAL_BASED_OUTPATIENT_CLINIC_OR_DEPARTMENT_OTHER): Payer: Self-pay | Admitting: Obstetrics & Gynecology

## 2024-04-08 DIAGNOSIS — Z1231 Encounter for screening mammogram for malignant neoplasm of breast: Secondary | ICD-10-CM

## 2024-04-09 ENCOUNTER — Encounter

## 2024-04-11 ENCOUNTER — Encounter

## 2024-04-11 DIAGNOSIS — Z1231 Encounter for screening mammogram for malignant neoplasm of breast: Secondary | ICD-10-CM

## 2024-04-12 ENCOUNTER — Encounter (HOSPITAL_BASED_OUTPATIENT_CLINIC_OR_DEPARTMENT_OTHER): Payer: Self-pay | Admitting: Radiology

## 2024-04-12 ENCOUNTER — Ambulatory Visit (HOSPITAL_BASED_OUTPATIENT_CLINIC_OR_DEPARTMENT_OTHER)
Admission: RE | Admit: 2024-04-12 | Discharge: 2024-04-12 | Disposition: A | Discharge status: Home / Self Care | Source: Ambulatory Visit

## 2024-04-12 DIAGNOSIS — Z1231 Encounter for screening mammogram for malignant neoplasm of breast: Secondary | ICD-10-CM | POA: Diagnosis not present

## 2024-05-07 ENCOUNTER — Other Ambulatory Visit (HOSPITAL_BASED_OUTPATIENT_CLINIC_OR_DEPARTMENT_OTHER): Payer: Self-pay

## 2024-05-07 DIAGNOSIS — L570 Actinic keratosis: Secondary | ICD-10-CM | POA: Diagnosis not present

## 2024-05-07 DIAGNOSIS — L821 Other seborrheic keratosis: Secondary | ICD-10-CM | POA: Diagnosis not present

## 2024-05-07 DIAGNOSIS — L218 Other seborrheic dermatitis: Secondary | ICD-10-CM | POA: Diagnosis not present

## 2024-05-07 DIAGNOSIS — L578 Other skin changes due to chronic exposure to nonionizing radiation: Secondary | ICD-10-CM | POA: Diagnosis not present

## 2024-05-07 DIAGNOSIS — L718 Other rosacea: Secondary | ICD-10-CM | POA: Diagnosis not present

## 2024-05-07 DIAGNOSIS — L814 Other melanin hyperpigmentation: Secondary | ICD-10-CM | POA: Diagnosis not present

## 2024-05-07 MED ORDER — FLUTICASONE PROPIONATE 0.05 % EX CREA
TOPICAL_CREAM | CUTANEOUS | 1 refills | Status: AC
  Filled 2024-05-07: qty 30, 30d supply, fill #0

## 2024-05-30 ENCOUNTER — Encounter: Payer: Self-pay | Admitting: Student in an Organized Health Care Education/Training Program

## 2024-05-30 ENCOUNTER — Other Ambulatory Visit (HOSPITAL_BASED_OUTPATIENT_CLINIC_OR_DEPARTMENT_OTHER): Payer: Self-pay

## 2024-05-30 ENCOUNTER — Encounter: Payer: 59 | Admitting: Family Medicine

## 2024-05-30 ENCOUNTER — Ambulatory Visit: Admitting: Student in an Organized Health Care Education/Training Program

## 2024-05-30 VITALS — BP 122/66 | HR 70 | Ht 65.0 in | Wt 144.0 lb

## 2024-05-30 DIAGNOSIS — E782 Mixed hyperlipidemia: Secondary | ICD-10-CM

## 2024-05-30 DIAGNOSIS — Z0000 Encounter for general adult medical examination without abnormal findings: Secondary | ICD-10-CM | POA: Diagnosis not present

## 2024-05-30 DIAGNOSIS — R233 Spontaneous ecchymoses: Secondary | ICD-10-CM

## 2024-05-30 DIAGNOSIS — R7303 Prediabetes: Secondary | ICD-10-CM | POA: Diagnosis not present

## 2024-05-30 DIAGNOSIS — G4700 Insomnia, unspecified: Secondary | ICD-10-CM | POA: Diagnosis not present

## 2024-05-30 LAB — CBC
HCT: 41 % (ref 36.0–46.0)
Hemoglobin: 13.4 g/dL (ref 12.0–15.0)
MCHC: 32.6 g/dL (ref 30.0–36.0)
MCV: 96.1 fl (ref 78.0–100.0)
Platelets: 242 K/uL (ref 150.0–400.0)
RBC: 4.27 Mil/uL (ref 3.87–5.11)
RDW: 13.7 % (ref 11.5–15.5)
WBC: 4.3 K/uL (ref 4.0–10.5)

## 2024-05-30 LAB — HEMOGLOBIN A1C: Hgb A1c MFr Bld: 5.9 % (ref 4.6–6.5)

## 2024-05-30 LAB — PROTIME-INR
INR: 1.1 ratio — ABNORMAL HIGH (ref 0.8–1.0)
Prothrombin Time: 11.6 s (ref 9.6–13.1)

## 2024-05-30 LAB — LIPID PANEL
Cholesterol: 229 mg/dL — ABNORMAL HIGH (ref 0–200)
HDL: 97.9 mg/dL (ref >39.00)
LDL Cholesterol: 117 mg/dL — ABNORMAL HIGH (ref 0–99)
NonHDL: 131.44
Total CHOL/HDL Ratio: 2
Triglycerides: 70 mg/dL (ref 0.0–149.0)
VLDL: 14 mg/dL (ref 0.0–40.0)

## 2024-05-30 LAB — BASIC METABOLIC PANEL WITH GFR
BUN: 16 mg/dL (ref 6–23)
CO2: 29 meq/L (ref 19–32)
Calcium: 9.7 mg/dL (ref 8.4–10.5)
Chloride: 100 meq/L (ref 96–112)
Creatinine, Ser: 0.76 mg/dL (ref 0.40–1.20)
GFR: 85.57 mL/min (ref >60.00)
Glucose, Bld: 93 mg/dL (ref 70–99)
Potassium: 4.2 meq/L (ref 3.5–5.1)
Sodium: 138 meq/L (ref 135–145)

## 2024-05-30 MED ORDER — LORAZEPAM 0.5 MG PO TABS
0.5000 mg | ORAL_TABLET | Freq: Every evening | ORAL | 0 refills | Status: AC | PRN
  Filled 2024-05-30: qty 20, 20d supply, fill #0

## 2024-05-30 NOTE — Assessment & Plan Note (Signed)
 Stress from her mother's fall is causing insomnia. She uses Ativan 0.5 mg sparingly. Discussed fall risk and advised against alcohol use with Ativan. Prescribe Ativan 0.5 mg as needed at night for stress and insomnia, with a prescription for 20 tablets.

## 2024-05-30 NOTE — Assessment & Plan Note (Signed)
 Bruising occurs on her arms post-exercise, with no other bleeding signs, suggesting possible platelet count abnormalities. Order platelet count, PT, and PTT to evaluate bruising.

## 2024-05-30 NOTE — Assessment & Plan Note (Signed)
 LDL was previously at 123. Annual monitoring is needed to manage cardiovascular risk. Check cholesterol levels annually.

## 2024-05-30 NOTE — Patient Instructions (Signed)
  VISIT SUMMARY: Today, you had your annual physical exam. You mentioned feeling tired due to spending the previous night at the emergency department with your mother. You are generally healthy, with no current medical problems, and you exercise regularly. We discussed your history of prediabetes, cholesterol levels, and recent bruising on your arms. We also talked about the stress and insomnia you're experiencing due to your mother's fall.  YOUR PLAN: -ADULT WELLNESS VISIT: Your annual exam is complete, and your blood pressure was slightly elevated, likely due to stress. Your previous lab results were normal, and you maintain a regular exercise routine. Continue with annual physical exams and mammograms every 1-2 years. We will order blood work to check your cholesterol, blood sugars, platelet count, PT, and PTT.  -PREDIABETES: Prediabetes means your blood sugar levels are higher than normal but not high enough to be classified as diabetes. Your A1c is currently at 5.8%, down from 6% last year. We will continue to monitor your A1c annually to prevent the progression to diabetes.  -HYPERLIPIDEMIA: Hyperlipidemia means you have high levels of lipids (fats) in your blood, which can increase your risk of cardiovascular disease. Your LDL was previously at 123. We will monitor your cholesterol levels annually to manage your cardiovascular risk.  -EASY BRUISING: You have been experiencing bruising on your arms after exercise, which may be due to platelet count abnormalities. We will order tests to check your platelet count, PT, and PTT to evaluate the cause of the bruising.  -ADJUSTMENT DISORDER WITH INSOMNIA: Adjustment disorder with insomnia means you are having trouble sleeping due to stress. The recent stress from your mother's fall is causing insomnia. You use Ativan 0.5 mg sparingly for this. We discussed the risk of falls and advised against using alcohol with Ativan. You will have a prescription for 20  tablets of Ativan 0.5 mg to use as needed at night for stress and insomnia.  INSTRUCTIONS: Please follow up with the recommended blood work to check your cholesterol, blood sugars, platelet count, PT, and PTT. Continue with your regular exercise routine and annual physical exams. Monitor your A1c and cholesterol levels annually. Use Ativan 0.5 mg as needed for stress and insomnia, but avoid alcohol when taking it. If you have any concerns or notice any new symptoms, please contact our office.

## 2024-05-30 NOTE — Assessment & Plan Note (Signed)
 A1c is at 5.8%, indicating prediabetes, down from a previous 6%. Monitoring is required to prevent diabetes progression. Check A1c annually.

## 2024-05-30 NOTE — Assessment & Plan Note (Signed)
 The annual exam is complete. Blood pressure is slightly elevated, likely due to stress. Previous labs are normal, and she maintains a regular exercise routine. Continue annual physical exams and mammograms every 1-2 years. Order blood work to check cholesterol, blood sugars, platelet count, PT, and PTT.

## 2024-05-30 NOTE — Progress Notes (Signed)
 Complete physical exam  Patient: Julie Dorsey   DOB: 1965-11-25   58 y.o. Female  MRN: [id number]  Subjective:    Chief Complaint  Patient presents with   Transitions Of Care    No concerns  Flu-patient will receive at [facility]-questions regarding vaccine  Covid-received last year but not this year,not interested today     Julie Dorsey is a 58 y.o. female who presents today for a complete physical exam. She reports consuming a general diet.  She generally feels well. She reports sleeping well. She does not have additional problems to discuss today.   Discussed the use of AI scribe software for clinical note transcription with the patient, who gave verbal consent to proceed.  History of Present Illness Julie Dorsey is a 58 year old female who presents for an annual physical exam.  She describes herself as healthy with no current medical problems. She feels tired due to spending the previous night at the emergency department with her mother, who had a fall resulting in multiple fractures. She is not on any prescription medications except for a multivitamin and calcium supplements. She has a prescription for Ativan 0.5 mg, which she uses sparingly, with the last prescription lasting over a year.  She has a history of prediabetes with an A1c of 5.8% as of October 2024, and a previous A1c of 6% in 2023. Her cholesterol levels have been monitored, with an LDL of 123 mg/dL noted in her last blood work.  She exercises regularly, running three days a week and attending classes two to three times a week. She reports bruising on her arms after doing bicep exercises, which she attributes to the weights, although she uses only eight-pound weights. No other easy bruising or bleeding issues. No shortness of breath, chest pain, or physical limitations during exercise.  Her social history includes occasional alcohol consumption, with one or two glasses of wine per month. She has no history of  surgeries or hospitalizations, except for dental implant procedures following a molar split in February of the previous year.  Her family history is notable for osteoporosis in her mother. Her mother lives independently but has been experiencing recent health issues.    Most recent fall risk assessment:    05/30/2024    9:53 AM  Fall Risk   Falls in the past year? 0  Number falls in past yr: 0  Injury with Fall? 0  Risk for fall due to : No Fall Risks  Follow up Falls evaluation completed     Most recent depression screenings:    05/30/2024    9:53 AM 05/26/2023    8:19 AM  PHQ 2/9 Scores  PHQ - 2 Score 0 0  PHQ- 9 Score 0     Patient Care Team: Jerrell Cleatus Ned, MD as PCP - General (Internal Medicine) Clark-Burning, Delon, PA-C (Inactive) (Dermatology)   Outpatient Medications Prior to Visit  Medication Sig   fluticasone  (CUTIVATE ) 0.05 % cream Apply twice a day to face 1-2wks on/off as needed for flare   Melatonin 10 MG TABS 1 tablet Orally Once a day at bedtime if needed   Methylcellulose, Laxative, (CITRUCEL) 500 MG TABS 1 tablet Orally once a day   Multiple Vitamin (MULTIVITAMIN) capsule Take 1 capsule by mouth daily.   TURMERIC PO Take by mouth.   [DISCONTINUED] diphenhydramine-acetaminophen (TYLENOL PM) 25-500 MG TABS tablet Take 1 tablet by mouth at bedtime as needed. (Patient not taking: Reported on 05/30/2024)   [  DISCONTINUED] zolpidem  (AMBIEN ) 5 MG tablet Take 1 tablet (5 mg total) by mouth at bedtime as needed for sleep. (Patient not taking: Reported on 03/04/2024)   No facility-administered medications prior to visit.       Objective:     BP 122/66   Pulse 70   Ht 5' 5 (1.651 m)   Wt 144 lb (65.3 kg)   SpO2 100%   BMI 23.96 kg/m   Physical Exam   Gen: Well-appearing woman Eyes: Normal bilaterally Ears: Normal tympanic membrane on the right, left tympanic membrane with a slight scar but otherwise normal Neck: Normal thyroid , no nodules or  adenopathy Heart: Regular, no murmur Lungs; unlabored, clear throughout Abd: Soft, nontender, no organomegaly Ext: Warm, no edema, normal joints Neuro: Alert, conversational, full strength upper and lower extremities Psych: Appropriate mood and affect, not anxious or depressed.     Assessment & Plan:    Routine Health Maintenance and Physical Exam  Immunization History  Administered Date(s) Administered   Fluzone Influenza virus vaccine,trivalent (IIV3), split virus 05/25/2015, 05/23/2016   Influenza-Unspecified 05/22/2021   PFIZER(Purple Top)SARS-COV-2 Vaccination 09/06/2019, 09/27/2019, 06/11/2020   Pfizer Covid-19 Vaccine Bivalent Booster 71yrs & up 06/15/2021   Pfizer(Comirnaty )Fall Seasonal Vaccine 12 years and older 06/30/2022, 07/18/2023   Tdap 11/05/2018   Zoster Recombinant(Shingrix) 01/03/2021, 07/06/2021    Health Maintenance  Topic Date Due   Pneumococcal Vaccine: 50+ Years (1 of 2 - PCV) Never done   Hepatitis B Vaccines 19-59 Average Risk (1 of 3 - 19+ 3-dose series) Never done   Influenza Vaccine  03/22/2024   COVID-19 Vaccine (7 - 2025-26 season) 04/22/2024   Mammogram  04/12/2026   Cervical Cancer Screening (HPV/Pap Cotest)  01/05/2028   DTaP/Tdap/Td (2 - Td or Tdap) 11/04/2028   Colonoscopy  10/14/2029   Zoster Vaccines- Shingrix  Completed   HPV VACCINES  Aged Out   Meningococcal B Vaccine  Aged Out   Hepatitis C Screening  Discontinued   HIV Screening  Discontinued    Discussed health benefits of physical activity, and encouraged her to engage in regular exercise appropriate for her age and condition.   Problem List Items Addressed This Visit       Medium    Insomnia (Chronic)   Stress from her mother's fall is causing insomnia. She uses Ativan 0.5 mg sparingly. Discussed fall risk and advised against alcohol use with Ativan. Prescribe Ativan 0.5 mg as needed at night for stress and insomnia, with a prescription for 20 tablets.      Relevant  Medications   LORazepam (ATIVAN) 0.5 MG tablet   Mixed hyperlipidemia (Chronic)   LDL was previously at 123. Annual monitoring is needed to manage cardiovascular risk. Check cholesterol levels annually.      Relevant Orders   Lipid panel   Prediabetes (Chronic)   A1c is at 5.8%, indicating prediabetes, down from a previous 6%. Monitoring is required to prevent diabetes progression. Check A1c annually.      Relevant Orders   Basic metabolic panel with GFR   Hemoglobin A1c     Low   Health maintenance examination - Primary (Chronic)   The annual exam is complete. Blood pressure is slightly elevated, likely due to stress. Previous labs are normal, and she maintains a regular exercise routine. Continue annual physical exams and mammograms every 1-2 years. Order blood work to check cholesterol, blood sugars, platelet count, PT, and PTT.        Unprioritized   Easy bruising  Bruising occurs on her arms post-exercise, with no other bleeding signs, suggesting possible platelet count abnormalities. Order platelet count, PT, and PTT to evaluate bruising.      Relevant Orders   CBC   Protime-INR   APTT   Return in about 1 year (around 05/30/2025).     Cleatus Debby Specking, MD

## 2024-05-31 ENCOUNTER — Ambulatory Visit: Payer: Self-pay | Admitting: Student in an Organized Health Care Education/Training Program

## 2024-06-13 ENCOUNTER — Emergency Department (HOSPITAL_COMMUNITY)

## 2024-06-13 ENCOUNTER — Other Ambulatory Visit: Payer: Self-pay

## 2024-06-13 ENCOUNTER — Other Ambulatory Visit: Payer: Self-pay | Admitting: Cardiology

## 2024-06-13 ENCOUNTER — Emergency Department (HOSPITAL_COMMUNITY)
Admission: EM | Admit: 2024-06-13 | Discharge: 2024-06-13 | Disposition: A | Discharge status: Home / Self Care | Attending: Emergency Medicine | Admitting: Emergency Medicine

## 2024-06-13 DIAGNOSIS — M4802 Spinal stenosis, cervical region: Secondary | ICD-10-CM | POA: Diagnosis not present

## 2024-06-13 DIAGNOSIS — R531 Weakness: Secondary | ICD-10-CM | POA: Diagnosis not present

## 2024-06-13 DIAGNOSIS — R55 Syncope and collapse: Secondary | ICD-10-CM

## 2024-06-13 DIAGNOSIS — S199XXA Unspecified injury of neck, initial encounter: Secondary | ICD-10-CM | POA: Diagnosis not present

## 2024-06-13 DIAGNOSIS — R001 Bradycardia, unspecified: Secondary | ICD-10-CM | POA: Diagnosis not present

## 2024-06-13 DIAGNOSIS — S0990XA Unspecified injury of head, initial encounter: Secondary | ICD-10-CM | POA: Diagnosis not present

## 2024-06-13 DIAGNOSIS — S00531A Contusion of lip, initial encounter: Secondary | ICD-10-CM | POA: Insufficient documentation

## 2024-06-13 DIAGNOSIS — R110 Nausea: Secondary | ICD-10-CM | POA: Diagnosis not present

## 2024-06-13 DIAGNOSIS — R569 Unspecified convulsions: Secondary | ICD-10-CM | POA: Diagnosis not present

## 2024-06-13 DIAGNOSIS — M4312 Spondylolisthesis, cervical region: Secondary | ICD-10-CM | POA: Diagnosis not present

## 2024-06-13 DIAGNOSIS — W19XXXA Unspecified fall, initial encounter: Secondary | ICD-10-CM | POA: Diagnosis not present

## 2024-06-13 DIAGNOSIS — S0993XA Unspecified injury of face, initial encounter: Secondary | ICD-10-CM | POA: Diagnosis not present

## 2024-06-13 DIAGNOSIS — M542 Cervicalgia: Secondary | ICD-10-CM | POA: Insufficient documentation

## 2024-06-13 DIAGNOSIS — S0083XA Contusion of other part of head, initial encounter: Secondary | ICD-10-CM | POA: Insufficient documentation

## 2024-06-13 DIAGNOSIS — X58XXXA Exposure to other specified factors, initial encounter: Secondary | ICD-10-CM | POA: Insufficient documentation

## 2024-06-13 DIAGNOSIS — M47812 Spondylosis without myelopathy or radiculopathy, cervical region: Secondary | ICD-10-CM | POA: Diagnosis not present

## 2024-06-13 LAB — ECHOCARDIOGRAM COMPLETE
Area-P 1/2: 3.21 cm2
S' Lateral: 3.2 cm

## 2024-06-13 LAB — I-STAT CHEM 8, ED
BUN: 20 mg/dL (ref 6–20)
Calcium, Ion: 0.99 mmol/L — ABNORMAL LOW (ref 1.15–1.40)
Chloride: 106 mmol/L (ref 98–111)
Creatinine, Ser: 0.8 mg/dL (ref 0.44–1.00)
Glucose, Bld: 111 mg/dL — ABNORMAL HIGH (ref 70–99)
HCT: 37 % (ref 36.0–46.0)
Hemoglobin: 12.6 g/dL (ref 12.0–15.0)
Potassium: 6.2 mmol/L — ABNORMAL HIGH (ref 3.5–5.1)
Sodium: 137 mmol/L (ref 135–145)
TCO2: 25 mmol/L (ref 22–32)

## 2024-06-13 LAB — CBC
HCT: 40.2 % (ref 36.0–46.0)
Hemoglobin: 13.2 g/dL (ref 12.0–15.0)
MCH: 31.4 pg (ref 26.0–34.0)
MCHC: 32.8 g/dL (ref 30.0–36.0)
MCV: 95.7 fL (ref 80.0–100.0)
Platelets: 232 K/uL (ref 150–400)
RBC: 4.2 MIL/uL (ref 3.87–5.11)
RDW: 12.9 % (ref 11.5–15.5)
WBC: 8 K/uL (ref 4.0–10.5)
nRBC: 0 % (ref 0.0–0.2)

## 2024-06-13 LAB — BASIC METABOLIC PANEL WITH GFR
Anion gap: 14 (ref 5–15)
BUN: 14 mg/dL (ref 6–20)
CO2: 23 mmol/L (ref 22–32)
Calcium: 9.1 mg/dL (ref 8.9–10.3)
Chloride: 101 mmol/L (ref 98–111)
Creatinine, Ser: 0.85 mg/dL (ref 0.44–1.00)
GFR, Estimated: >60 mL/min (ref >60)
Glucose, Bld: 109 mg/dL — ABNORMAL HIGH (ref 70–99)
Potassium: 4.5 mmol/L (ref 3.5–5.1)
Sodium: 138 mmol/L (ref 135–145)

## 2024-06-13 LAB — T4, FREE: Free T4: 0.91 ng/dL (ref 0.61–1.12)

## 2024-06-13 LAB — TROPONIN I (HIGH SENSITIVITY)
Troponin I (High Sensitivity): 5 ng/L (ref <18)
Troponin I (High Sensitivity): 4 ng/L (ref <18)

## 2024-06-13 LAB — RESP PANEL BY RT-PCR (RSV, FLU A&B, COVID)  RVPGX2
Influenza A by PCR: NEGATIVE
Influenza B by PCR: NEGATIVE
Resp Syncytial Virus by PCR: NEGATIVE
SARS Coronavirus 2 by RT PCR: NEGATIVE

## 2024-06-13 LAB — TSH: TSH: 0.722 u[IU]/mL (ref 0.350–4.500)

## 2024-06-13 NOTE — Progress Notes (Signed)
  Echocardiogram 2D Echocardiogram has been performed.  Julie Dorsey, RDCS 06/13/2024, 3:21 PM

## 2024-06-13 NOTE — Discharge Instructions (Signed)
 Please follow-up as discussed with cardiology.  If you do not receive information from them by tomorrow afternoon, please call the number attached for [facility]. Keep head of your bed elevated and use cold packs as needed for swelling. Return if you are having any new or worsening symptoms.

## 2024-06-13 NOTE — ED Provider Notes (Signed)
 [place] EMERGENCY DEPARTMENT AT [facility] Provider Note   CSN: [id number] Arrival date & time: 06/13/24  1247     Patient presents with: Bradycardia and Loss of Consciousness   Julie Dorsey is a 58 y.o. female.   HPI 58 year old female presents today from orthopedic office with syncopal episode.  She was accompanying a family member there.  She had 2 episodes of syncope.  She reports that she has had some nasal congestion and took over-the-counter cold medication     Prior to Admission medications   Medication Sig Start Date End Date Taking? Authorizing Provider  fluticasone  (CUTIVATE ) 0.05 % cream Apply twice a day to face 1-2wks on/off as needed for flare 05/07/24     LORazepam (ATIVAN) 0.5 MG tablet Take 1 tablet (0.5 mg total) by mouth at bedtime as needed for sleep. 05/30/24   Jerrell Cleatus Ned, MD  Melatonin 10 MG TABS 1 tablet Orally Once a day at bedtime if needed    [provider]  Methylcellulose, Laxative, (CITRUCEL) 500 MG TABS 1 tablet Orally once a day    [provider]  Multiple Vitamin (MULTIVITAMIN) capsule Take 1 capsule by mouth daily.    [provider]  TURMERIC PO Take by mouth.    [provider]    Allergies: Penicillins    Review of Systems  Updated Vital Signs BP 132/74   Pulse (!) 51   Temp 98 F (36.7 C) (Oral)   Resp 17   SpO2 100%   Physical Exam Vitals and nursing note reviewed.  Constitutional:      Appearance: Normal appearance.  HENT:     Head: Normocephalic.     Comments: Laceration x 2 above left eye-there is no gap in wound and appears well aligned Tenderness over left cheek with contusion    Right Ear: External ear normal.     Left Ear: External ear normal.     Nose: Nose normal.     Mouth/Throat:     Mouth: Mucous membranes are moist.     Comments: Contusion of the lower lip Dentition intact Eyes:     Extraocular Movements: Extraocular movements intact.     Pupils:  Pupils are equal, round, and reactive to light.  Neck:     Comments: Some tenderness in the left lateral neck with emptying to turn neck No point tenderness over cervical spine Cardiovascular:     Rate and Rhythm: Regular rhythm. Bradycardia present.     Pulses: Normal pulses.  Pulmonary:     Effort: Pulmonary effort is normal.     Breath sounds: Normal breath sounds.  Abdominal:     General: Abdomen is flat.     Palpations: Abdomen is soft.  Musculoskeletal:        General: Normal range of motion.  Skin:    General: Skin is warm and dry.     Capillary Refill: Capillary refill takes less than 2 seconds.  Neurological:     General: No focal deficit present.     Mental Status: She is alert.     Cranial Nerves: No cranial nerve deficit.     Motor: No weakness.  Psychiatric:        Mood and Affect: Mood normal.        Behavior: Behavior normal.     (all labs ordered are listed, but only abnormal results are displayed) Labs Reviewed  BASIC METABOLIC PANEL WITH GFR - Abnormal; Notable for the following components:  Result Value   Glucose, Bld 109 (*)    All other components within normal limits  I-STAT CHEM 8, ED - Abnormal; Notable for the following components:   Potassium 6.2 (*)    Glucose, Bld 111 (*)    Calcium, Ion 0.99 (*)    All other components within normal limits  RESP PANEL BY RT-PCR (RSV, FLU A&B, COVID)  RVPGX2  CBC  TSH  T4, FREE  TROPONIN I (HIGH SENSITIVITY)  TROPONIN I (HIGH SENSITIVITY)    EKG: None  Radiology: ECHOCARDIOGRAM COMPLETE Result Date: 06/13/2024    ECHOCARDIOGRAM REPORT   Patient Name:   Julie Dorsey Date of Exam: 06/13/2024 Medical Rec #:  [id number]    Height:       65.0 in Accession #:    [id number]   Weight:       144.0 lb Date of Birth:  04-08-1966     BSA:          1.720 m Patient Age:    58 years     BP:           134/53 mmHg Patient Gender: F            HR:           44 bpm. Exam Location:  Inpatient Procedure: 2D Echo, 3D  Echo, Strain Analysis, Cardiac Doppler and Color Doppler            (Both Spectral and Color Flow Doppler were utilized during            procedure). Indications:    Syncope R55  History:        Patient has no prior history of Echocardiogram examinations.                 Arrythmias:Bradycardia, Signs/Symptoms:Syncope; Risk                 Factors:Dyslipidemia and Pre-diabetes.  Sonographer:    Koleen Popper RDCS Referring Phys: [id number] ROLLO JONELLE LOUDER  Sonographer Comments: Image acquisition challenging due to respiratory motion. Global longitudinal strain was attempted. IMPRESSIONS  1. Left ventricular ejection fraction, by estimation, is 55 to 60%. Left ventricular ejection fraction by 3D volume is 55 %. The left ventricle has normal function. The left ventricle has no regional wall motion abnormalities. Left ventricular diastolic  parameters were normal. The average left ventricular global longitudinal strain is -25.7 %. The global longitudinal strain is normal.  2. Right ventricular systolic function is normal. The right ventricular size is normal. There is normal pulmonary artery systolic pressure.  3. The mitral valve is normal in structure. Trivial mitral valve regurgitation. No evidence of mitral stenosis.  4. The aortic valve is tricuspid. There is mild calcification of the aortic valve. Aortic valve regurgitation is not visualized. Aortic valve sclerosis/calcification is present, without any evidence of aortic stenosis.  5. The inferior vena cava is normal in size with greater than 50% respiratory variability, suggesting right atrial pressure of 3 mmHg. FINDINGS  Left Ventricle: Left ventricular ejection fraction, by estimation, is 55 to 60%. Left ventricular ejection fraction by 3D volume is 55 %. The left ventricle has normal function. The left ventricle has no regional wall motion abnormalities. The average left ventricular global longitudinal strain is -25.7 %. Strain was performed and the global  longitudinal strain is normal. The left ventricular internal cavity size was normal in size. There is no left ventricular hypertrophy. Left ventricular diastolic parameters were normal. Right Ventricle: The  right ventricular size is normal. No increase in right ventricular wall thickness. Right ventricular systolic function is normal. There is normal pulmonary artery systolic pressure. The tricuspid regurgitant velocity is 2.28 m/s, and  with an assumed right atrial pressure of 3 mmHg, the estimated right ventricular systolic pressure is 23.8 mmHg. Left Atrium: Left atrial size was normal in size. Right Atrium: Right atrial size was normal in size. Pericardium: There is no evidence of pericardial effusion. Mitral Valve: The mitral valve is normal in structure. Trivial mitral valve regurgitation. No evidence of mitral valve stenosis. Tricuspid Valve: The tricuspid valve is normal in structure. Tricuspid valve regurgitation is mild . No evidence of tricuspid stenosis. Aortic Valve: The aortic valve is tricuspid. There is mild calcification of the aortic valve. Aortic valve regurgitation is not visualized. Aortic valve sclerosis/calcification is present, without any evidence of aortic stenosis. Pulmonic Valve: The pulmonic valve was normal in structure. Pulmonic valve regurgitation is trivial. No evidence of pulmonic stenosis. Aorta: The aortic root is normal in size and structure. Venous: The inferior vena cava is normal in size with greater than 50% respiratory variability, suggesting right atrial pressure of 3 mmHg. IAS/Shunts: No atrial level shunt detected by color flow Doppler. Additional Comments: 3D was performed not requiring image post processing on an independent workstation and was normal.  LEFT VENTRICLE PLAX 2D LVIDd:         4.40 cm         Diastology LVIDs:         3.20 cm         LV e' medial:    14.00 cm/s LV PW:         0.80 cm         LV E/e' medial:  6.6 LV IVS:        0.80 cm         LV e'  lateral:   20.90 cm/s LVOT diam:     1.90 cm         LV E/e' lateral: 4.4 LV SV:         99 LV SV Index:   57              2D Longitudinal LVOT Area:     2.84 cm        Strain                                2D Strain GLS   -25.7 %                                Avg:                                 3D Volume EF                                LV 3D EF:    Left                                             ventricul  ar                                             ejection                                             fraction                                             by 3D                                             volume is                                             55 %.                                 3D Volume EF:                                3D EF:        55 %                                LV EDV:       130 ml                                LV ESV:       58 ml                                LV SV:        71 ml RIGHT VENTRICLE             IVC RV Basal diam:  4.00 cm     IVC diam: 1.80 cm RV S prime:     15.30 cm/s TAPSE (M-mode): 3.1 cm LEFT ATRIUM             Index        RIGHT ATRIUM           Index LA diam:        3.60 cm 2.09 cm/m   RA Area:     18.60 cm LA Vol (A2C):   41.1 ml 23.89 ml/m  RA Volume:   51.40 ml  29.88 ml/m LA Vol (A4C):   39.0 ml 22.67 ml/m LA Biplane Vol: 41.7 ml 24.24 ml/m  AORTIC VALVE LVOT Vmax:   151.00 cm/s LVOT Vmean:  97.100 cm/s LVOT VTI:    0.348 m  AORTA Ao Root diam: 2.50 cm Ao Asc diam:  3.10 cm MITRAL  VALVE               TRICUSPID VALVE MV Area (PHT): 3.21 cm    TR Peak grad:   20.8 mmHg MV Decel Time: 236 msec    TR Vmax:        228.00 cm/s MV E velocity: 91.70 cm/s MV A velocity: 81.20 cm/s  SHUNTS MV E/A ratio:  1.13        Systemic VTI:  0.35 m                            Systemic Diam: 1.90 cm Toribio Fuel MD Electronically signed by Toribio Fuel MD Signature Date/Time: 06/13/2024/5:20:46 PM    Final    CT  Maxillofacial Wo Contrast Result Date: 06/13/2024 CLINICAL DATA:  Clemens, facial injury EXAM: CT MAXILLOFACIAL WITHOUT CONTRAST TECHNIQUE: Multidetector CT imaging of the maxillofacial structures was performed. Multiplanar CT image reconstructions were also generated. RADIATION DOSE REDUCTION: This exam was performed according to the departmental dose-optimization program which includes automated exposure control, adjustment of the mA and/or kV according to patient size and/or use of iterative reconstruction technique. COMPARISON:  None Available. FINDINGS: Osseous: No fracture or mandibular dislocation. No destructive process. Orbits: Negative. No traumatic or inflammatory finding. Sinuses: Clear. Soft tissues: Mild left infraorbital soft tissue swelling. Remaining soft tissues are unremarkable. Limited intracranial: No significant or unexpected finding. IMPRESSION: 1. Mild left infraorbital soft tissue swelling. No displaced fractures. Electronically Signed   By: Ozell Daring M.D.   On: 06/13/2024 16:00   DG Chest Port 1 View Result Date: 06/13/2024 CLINICAL DATA:  Bradycardia EXAM: PORTABLE CHEST 1 VIEW COMPARISON:  None Available. FINDINGS: Single frontal view of the chest demonstrates external fibrillator pads. Cardiac silhouette is unremarkable. No acute airspace disease, effusion, or pneumothorax. No acute bony abnormalities. IMPRESSION: 1. No acute intrathoracic process. Electronically Signed   By: Ozell Daring M.D.   On: 06/13/2024 15:44   CT Head Wo Contrast Result Date: 06/13/2024 EXAM: CT HEAD WITHOUT CONTRAST 06/13/2024 02:23:28 PM TECHNIQUE: CT of the head was performed without the administration of intravenous contrast. Automated exposure control, iterative reconstruction, and/or weight based adjustment of the mA/kV was utilized to reduce the radiation dose to as low as reasonably achievable. COMPARISON: None available. CLINICAL HISTORY: Head trauma, moderate-severe. FINDINGS: BRAIN AND  VENTRICLES: No acute hemorrhage. No evidence of acute infarct. No hydrocephalus. No extra-axial collection. No mass effect or midline shift. ORBITS: No acute abnormality. SINUSES: No acute abnormality. SOFT TISSUES AND SKULL: No acute soft tissue abnormality. No skull fracture. IMPRESSION: 1. No acute intracranial abnormality. Electronically signed by: Donnice Mania MD 06/13/2024 02:37 PM EDT RP Workstation: [id number]   CT Cervical Spine Wo Contrast Result Date: 06/13/2024 CLINICAL DATA:  Neck trauma, focal neuro deficit or paresthesia (Age 52-64y) EXAM: CT CERVICAL SPINE WITHOUT CONTRAST TECHNIQUE: Multidetector CT imaging of the cervical spine was performed without intravenous contrast. Multiplanar CT image reconstructions were also generated. RADIATION DOSE REDUCTION: This exam was performed according to the departmental dose-optimization program which includes automated exposure control, adjustment of the mA and/or kV according to patient size and/or use of iterative reconstruction technique. COMPARISON:  None Available. FINDINGS: Alignment: Gradual reversal of the usual cervical lordosis with a minimal degenerative anterolisthesis at C4-5. Skull base and vertebrae: No evidence of acute cervical spine fracture or traumatic subluxation. Incomplete posterior arch of C1 (normal variant). Soft tissues and spinal canal: No prevertebral fluid or swelling. No visible canal  hematoma. Disc levels: Moderate multilevel spondylosis with disc space narrowing and uncinate spurring greatest at C5-6 and C6-7. No evidence of large disc herniation or high-grade spinal stenosis. Up to mild spinal stenosis and right-sided foraminal narrowing at C5-6. Upper chest: Clear lung apices. Other: None. IMPRESSION: 1. No evidence of acute cervical spine fracture, traumatic subluxation or static signs of instability. 2. Moderate multilevel cervical spondylosis as described. Electronically Signed   By: Elsie Perone M.D.   On:  06/13/2024 14:35     Procedures   Medications Ordered in the ED - No data to display  Clinical Course as of 06/13/24 1728  Thu Jun 13, 2024  1400 Initial i-STAT returned with potassium of 6.2.  Renal function is normal. Plan redraw and repeat of i-STAT [DR]  1537 CT head obtained no acute intracranial abnormalities [DR]  1537 CT cervical spine obtained and no evidence of acute cervical spine fracture [DR]  1622 Chest x-Taishawn Smaldone without acute intrathoracic process [DR]    Clinical Course User Index [DR] Levander Houston, MD                                 Medical Decision Making Amount and/or Complexity of Data Reviewed Labs: ordered. Radiology: ordered.   31 female history of resting bradycardia thought to be secondary to exercise presents today after syncopal episode.  She was with a family member at a physician's office.  She was standing in the corner when she had a syncopal episode. Of note, patient took NyQuil last night. Prehospital rhythm strips show rate down into the 30s.  It has since rebounded and on my exam is up in the 40s.  Patient's blood pressure has remained stable.  She is awake and alert. She had trauma to her face.  There is a laceration above her left eye that does not appear to need repair. Patient was evaluated here with EKG which shows a sinus bradycardia rate of 40.  No acutely ischemic changes are noted Differential diagnosis includes but is not limited to syncope secondary to bradycardia, electrolyte abnormality, anemia, seizure disorder, acute coronary syndrome CBC is normal-without anemia EKG is significant for heart rate of 40. Prehospital strip reviewed and heart rate in the 30s. Cardiology was emergently consulted. Echo and echo results are pending and awaiting their final recommendations T4 40 is 0.91, TSH 0.722 these are both within normal limits  1 syncope-doubt seizure, evidence of anemia or electrolyte abnormality Concern for bradycardia awaiting  cardiology input Patient is to have monitor sent to her and she is to follow-up with [facility]. 2 trauma to face and head from fall-CT head, neck, and face without acute intracranial hemorrhage or bony injury      Final diagnoses:  Syncope and collapse  Bradycardia  Contusion of face, initial encounter    ED Discharge Orders     None          Levander Houston, MD 06/13/24 1728

## 2024-06-13 NOTE — ED Triage Notes (Signed)
 Patient BIB [facility] from doctors office for bradycardia & syncopal episodes . Lightheaded, dizzy and hit face on Left side.  Baseline HR in 40s. HR 36 100/60, 127 CBG  4mg  zofran given 20G L AC

## 2024-06-13 NOTE — Consult Note (Signed)
 Cardiology Consult   Patient ID: Julie Dorsey MRN: [id number]; DOB: [birth year]   Admission date: 06/13/2024  PCP:  Jerrell Cleatus Ned, MD   [facility] Providers Cardiologist:  None    Chief Complaint:  Syncope  Patient Profile: Julie Dorsey is a 58 y.o. female without significant medical history who is being seen 06/13/2024 for the evaluation of syncope at the request of Dr Levander.  History of Present Illness: Julie Dorsey is a 58 year old female with above medical history.  Patient denies having any past cardiac history.  She follows with her primary care provider regularly for her physical exams.  She was seen by her PCP on 05/30/2024.  At that time, patient had been struggling with some insomnia.  Her mother had a fall that resulted in quite a few injuries.  Because of this she was under a lot of stress and was started on Ativan as needed.  Lab work from that visit showed normal BMP, normal CBC.   Patient tells me that she is an avid runner.  Runs 6-8 miles per day.  She has been running for about 18 years.  She wears an Scientist, physiological and her heart rate is often in the 40s.  She has had a couple of alarms for heart rate in the upper 30s and has been asymptomatic with this in the past.  Patient tells me she has been feeling poorly recently.  She has been under a lot of stress after her mother's fall.  Because of this she has not been sleeping well.  She also started to feel like she was developing a cold.  Had a runny nose and fatigue.  She has been taking NyQuil at night.  Took DayQuil this morning.  Earlier today, she was taking her mother to the orthopedic office today for an appointment.  She had been standing in a corner during the appointment.  While standing, she started to feel a bit dizzy.  Next thing she knew, she was on the ground.  She has never had a syncopal episode before.  She did not have chest pain, palpitations, shortness of breath around the event.  She has never  had the symptoms in her past.  She denies family history of heart problems.  Her husband and son are in the room with her.  Her son is a MD through [facility] [employer] health primary care.  Past Medical History:  Diagnosis Date   Hx of retinal hemorrhage    right eye   Past Surgical History:  Procedure Laterality Date   DIGIT NAIL REMOVAL Right 04/2017   right toe nail removed   WISDOM TOOTH EXTRACTION       Medications Prior to Admission: Prior to Admission medications   Medication Sig Start Date End Date Taking? Authorizing Provider  fluticasone  (CUTIVATE ) 0.05 % cream Apply twice a day to face 1-2wks on/off as needed for flare 05/07/24     LORazepam (ATIVAN) 0.5 MG tablet Take 1 tablet (0.5 mg total) by mouth at bedtime as needed for sleep. 05/30/24   Jerrell Cleatus Ned, MD  Melatonin 10 MG TABS 1 tablet Orally Once a day at bedtime if needed    [provider]  Methylcellulose, Laxative, (CITRUCEL) 500 MG TABS 1 tablet Orally once a day    [provider]  Multiple Vitamin (MULTIVITAMIN) capsule Take 1 capsule by mouth daily.    [provider]  TURMERIC PO Take by mouth.    [provider]  Allergies:    Allergies  Allergen Reactions   Penicillins Hives    Social History:   Social History   Socioeconomic History   Marital status: Married    Spouse name: Not on file   Number of children: Not on file   Years of education: Not on file   Highest education level: Not on file  Occupational History   Not on file  Tobacco Use   Smoking status: Never   Smokeless tobacco: Never  Vaping Use   Vaping status: Never Used  Substance and Sexual Activity   Alcohol use: Yes    Comment: 2 glasses of wine per month   Drug use: Never   Sexual activity: Yes    Partners: Male    Birth control/protection: Post-menopausal  Other Topics Concern   Not on file  Social History Narrative   Works with [employer] as a Engineer, 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   Social Drivers of  Health   Financial Resource Strain: Low Risk  (03/04/2024)   Overall Financial Resource Strain (CARDIA)    Difficulty of Paying Living Expenses: Not hard at all  Food Insecurity: No Food Insecurity (03/04/2024)   Hunger Vital Sign    Worried About Running Out of Food in the Last Year: Never true    Ran Out of Food in the Last Year: Never true  Transportation Needs: No Transportation Needs (03/04/2024)   PRAPARE - Administrator, Civil Service (Medical): No    Lack of Transportation (Non-Medical): No  Physical Activity: Sufficiently Active (03/04/2024)   Exercise Vital Sign    Days of Exercise per Week: 4 days    Minutes of Exercise per Session: 150+ min  Stress: Not on file  Social Connections: Socially Integrated (03/04/2024)   Social Connection and Isolation Panel    Frequency of Communication with Friends and Family: More than three times a week    Frequency of Social Gatherings with Friends and Family: More than three times a week    Attends Religious Services: More than 4 times per year    Active Member of [employer] or Organizations: Yes    Attends Banker Meetings: More than 4 times per year    Marital Status: Married  Catering manager Violence: Not At Risk (03/04/2024)   Humiliation, Afraid, Rape, and Kick questionnaire    Fear of Current or Ex-Partner: No    Emotionally Abused: No    Physically Abused: No    Sexually Abused: No     Family History:  The patient's family history includes Breast cancer (age of onset: 57) in her mother; Cancer (age of onset: 30) in her father; Diabetes in her paternal grandfather; Glaucoma in her mother; Hearing loss in her maternal grandmother and paternal grandmother; Heart disease in her maternal grandfather, maternal grandmother, paternal grandfather, and paternal grandmother; Hypertension in her father and mother; Macular degeneration in her mother; Stroke in her paternal grandfather.    ROS:  Please see the history of  present illness.  All other ROS reviewed and negative.     Physical Exam/Data: Vitals:   06/13/24 1256  BP: (!) 134/55  Pulse: (!) 36  Resp: 17  Temp: 97.7 F (36.5 C)  TempSrc: Oral  SpO2: 100%   No intake or output data in the 24 hours ending 06/13/24 1421    05/30/2024    9:46 AM 03/04/2024    4:10 PM 05/26/2023    8:17 AM  Last 3 Weights  Weight (lbs) 144  lb 143 lb 6.4 oz 147 lb 6.4 oz  Weight (kg) 65.318 kg 65.046 kg 66.86 kg     There is no height or weight on file to calculate BMI.  General:  Well nourished, well developed, in no acute distress. Laying in the bed with head elevated HEENT: normal Neck: no  JVD Vascular: Radial pulses 2+ bilaterally   Cardiac:  normal S1, S2; RRR; no murmur   Lungs:  clear to auscultation bilaterally, no wheezing, rhonchi or rales. Normal WOB on room air  Abd: soft, nontender  Ext: no  edema Musculoskeletal:  No deformities  Skin: warm and dry  Neuro:  CNs 2-12 intact, no focal abnormalities noted Psych:  Normal affect   EKG:  The ECG that was done 10/23 was personally reviewed and demonstrates sinus bradycardia with HR 40 BPM   Relevant CV Studies:    Laboratory Data: High Sensitivity Troponin:  No results for input(s): TROPONINIHS in the last 720 hours.    Chemistry Recent Labs  Lab 06/13/24 1336  NA 137  K 6.2*  CL 106  GLUCOSE 111*  BUN 20  CREATININE 0.80    No results for input(s): PROT, ALBUMIN, AST, ALT, ALKPHOS, BILITOT in the last 168 hours. Lipids No results for input(s): CHOL, TRIG, HDL, LABVLDL, LDLCALC, CHOLHDL in the last 168 hours. Hematology Recent Labs  Lab 06/13/24 1336 06/13/24 1343  WBC  --  8.0  RBC  --  4.20  HGB 12.6 13.2  HCT 37.0 40.2  MCV  --  95.7  MCH  --  31.4  MCHC  --  32.8  RDW  --  12.9  PLT  --  232   Thyroid  No results for input(s): TSH, FREET4 in the last 168 hours. BNPNo results for input(s): BNP, PROBNP in the last 168 hours.  DDimer  No results for input(s): DDIMER in the last 168 hours.  Radiology/Studies:  No results found.   Assessment and Plan:  Syncope  Bradycardia  - Patient does not have any personal or family cardiac history.  Does not take any medications aside from vitamins.  She is very active in her everyday life.  Often runs 6-8 miles.  She wears an Apple Watch, heart rate often in the 40s.  For the past month or so, she has had occasional alerts that her heart rate was 38-39 bpm.  Notes that she was asymptomatic with these episodes. - Patient tells me she has been feeling poorly recently.  Has been under a lot of stress due to her mother falling and sustaining multiple injuries.  She has not been sleeping well and has been started on Ativan for this.  For the past couple days she has also been feeling like she has a cold has been taking NyQuil and DayQuil - She was standing in an orthopedic office earlier today when she started to feel dizzy and lost consciousness.  No chest pain, shortness of breath, palpitations - Heart rate is in the 40s.  BP stable in the 130s/70s systolic. - Patient denies ever having symptoms when heart rate was in the 40s before, and this seems to be her baseline. I reviewed her apple watch data which confirmed resting HR in the 40s.  I am not convinced that her low heart rate was cause of her syncope. Possible vasovagal episode  - Ordered echocardiogram - working with echo department to get this completed today  - Discharge with cardiac monitor - TSH, free T4 pending  Dr.  Floretta to see     Risk Assessment/Risk Scores:  For questions or updates, please contact [facility] Please consult [email or web address] for contact info under    Signed, Rollo FABIENE Louder, PA-C  06/13/2024 2:21 PM

## 2024-06-14 ENCOUNTER — Ambulatory Visit: Attending: Cardiology

## 2024-06-14 DIAGNOSIS — R55 Syncope and collapse: Secondary | ICD-10-CM

## 2024-06-14 NOTE — Progress Notes (Unsigned)
 Enrolled patient for a 7 day Zio AT monitor to be mailed to patients home   Floretta to read

## 2024-06-19 ENCOUNTER — Other Ambulatory Visit (HOSPITAL_BASED_OUTPATIENT_CLINIC_OR_DEPARTMENT_OTHER): Payer: Self-pay

## 2024-06-19 DIAGNOSIS — R55 Syncope and collapse: Secondary | ICD-10-CM | POA: Diagnosis not present

## 2024-06-19 MED ORDER — FLUZONE 0.5 ML IM SUSY
0.5000 mL | PREFILLED_SYRINGE | Freq: Once | INTRAMUSCULAR | 0 refills | Status: AC
  Filled 2024-06-19: qty 0.5, 1d supply, fill #0

## 2024-07-05 DIAGNOSIS — H2511 Age-related nuclear cataract, right eye: Secondary | ICD-10-CM | POA: Diagnosis not present

## 2024-07-05 DIAGNOSIS — H43811 Vitreous degeneration, right eye: Secondary | ICD-10-CM | POA: Diagnosis not present

## 2024-07-12 DIAGNOSIS — R55 Syncope and collapse: Secondary | ICD-10-CM

## 2024-07-15 ENCOUNTER — Ambulatory Visit: Payer: Self-pay | Admitting: Cardiology

## 2024-07-16 NOTE — Progress Notes (Signed)
 Cardiology Office Note:   Date:  07/16/2024  ID:  Julie Dorsey, DOB 01-06-1966, MRN [id number] PCP: Jerrell Cleatus Ned, MD  [facility] Providers Cardiologist:  None { Chief Complaint: No chief complaint on file.     History of Present Illness:   Julie Dorsey is a 58 y.o. female with a PMH of HLD and vasovagal syncope who presents for follow up.  I met [name] when she presented to the ED after suffering a syncopal event.  I reviewed her EMS EKG strip data and it showed that she had marked bradycardia and a junctional escape rhythm during her syncopal event.  Her story was most consistent with vasovagal syncope.  Her echocardiogram showed mild TR, but was otherwise normal.  She wore a heart monitor which showed resting sinus bradycardia and rare SVT.  She had no patient triggered events.  She presents today for follow-up.  Today she says that she is doing very well.  She has been remaining physically active without any limitation.  No more syncopal events.  Denies any other symptoms including chest pain, SOB, presyncope, palpitations, swelling, PND and orthopnea.   Past Medical History:  Diagnosis Date   Hx of retinal hemorrhage    right eye     Studies Reviewed:    EKG: No new ECG       Cardiac Studies & Procedures   ______________________________________________________________________________________________     ECHOCARDIOGRAM  ECHOCARDIOGRAM COMPLETE 06/13/2024  Narrative ECHOCARDIOGRAM REPORT    Patient Name:   Julie Dorsey Date of Exam: 06/13/2024 Medical Rec #:  [id number]    Height:       65.0 in Accession #:    [id number]   Weight:       144.0 lb Date of Birth:  1965-11-02     BSA:          1.720 m Patient Age:    58 years     BP:           134/53 mmHg Patient Gender: F            HR:           44 bpm. Exam Location:  Inpatient  Procedure: 2D Echo, 3D Echo, Strain Analysis, Cardiac Doppler and Color Doppler (Both Spectral and Color Flow  Doppler were utilized during procedure).  Indications:    Syncope R55  History:        Patient has no prior history of Echocardiogram examinations. Arrythmias:Bradycardia, Signs/Symptoms:Syncope; Risk Factors:Dyslipidemia and Pre-diabetes.  Sonographer:    Koleen Popper RDCS Referring Phys: [id number] ROLLO JONELLE LOUDER   Sonographer Comments: Image acquisition challenging due to respiratory motion. Global longitudinal strain was attempted. IMPRESSIONS   1. Left ventricular ejection fraction, by estimation, is 55 to 60%. Left ventricular ejection fraction by 3D volume is 55 %. The left ventricle has normal function. The left ventricle has no regional wall motion abnormalities. Left ventricular diastolic parameters were normal. The average left ventricular global longitudinal strain is -25.7 %. The global longitudinal strain is normal. 2. Right ventricular systolic function is normal. The right ventricular size is normal. There is normal pulmonary artery systolic pressure. 3. The mitral valve is normal in structure. Trivial mitral valve regurgitation. No evidence of mitral stenosis. 4. The aortic valve is tricuspid. There is mild calcification of the aortic valve. Aortic valve regurgitation is not visualized. Aortic valve sclerosis/calcification is present, without any evidence of aortic stenosis. 5. The inferior vena cava is normal in  size with greater than 50% respiratory variability, suggesting right atrial pressure of 3 mmHg.  FINDINGS Left Ventricle: Left ventricular ejection fraction, by estimation, is 55 to 60%. Left ventricular ejection fraction by 3D volume is 55 %. The left ventricle has normal function. The left ventricle has no regional wall motion abnormalities. The average left ventricular global longitudinal strain is -25.7 %. Strain was performed and the global longitudinal strain is normal. The left ventricular internal cavity size was normal in size. There is no left  ventricular hypertrophy. Left ventricular diastolic parameters were normal.  Right Ventricle: The right ventricular size is normal. No increase in right ventricular wall thickness. Right ventricular systolic function is normal. There is normal pulmonary artery systolic pressure. The tricuspid regurgitant velocity is 2.28 m/s, and with an assumed right atrial pressure of 3 mmHg, the estimated right ventricular systolic pressure is 23.8 mmHg.  Left Atrium: Left atrial size was normal in size.  Right Atrium: Right atrial size was normal in size.  Pericardium: There is no evidence of pericardial effusion.  Mitral Valve: The mitral valve is normal in structure. Trivial mitral valve regurgitation. No evidence of mitral valve stenosis.  Tricuspid Valve: The tricuspid valve is normal in structure. Tricuspid valve regurgitation is mild . No evidence of tricuspid stenosis.  Aortic Valve: The aortic valve is tricuspid. There is mild calcification of the aortic valve. Aortic valve regurgitation is not visualized. Aortic valve sclerosis/calcification is present, without any evidence of aortic stenosis.  Pulmonic Valve: The pulmonic valve was normal in structure. Pulmonic valve regurgitation is trivial. No evidence of pulmonic stenosis.  Aorta: The aortic root is normal in size and structure.  Venous: The inferior vena cava is normal in size with greater than 50% respiratory variability, suggesting right atrial pressure of 3 mmHg.  IAS/Shunts: No atrial level shunt detected by color flow Doppler.  Additional Comments: 3D was performed not requiring image post processing on an independent workstation and was normal.   LEFT VENTRICLE PLAX 2D LVIDd:         4.40 cm         Diastology LVIDs:         3.20 cm         LV e' medial:    14.00 cm/s LV PW:         0.80 cm         LV E/e' medial:  6.6 LV IVS:        0.80 cm         LV e' lateral:   20.90 cm/s LVOT diam:     1.90 cm         LV E/e' lateral:  4.4 LV SV:         99 LV SV Index:   57              2D Longitudinal LVOT Area:     2.84 cm        Strain 2D Strain GLS   -25.7 % Avg:  3D Volume EF LV 3D EF:    Left ventricul ar ejection fraction by 3D volume is 55 %.  3D Volume EF: 3D EF:        55 % LV EDV:       130 ml LV ESV:       58 ml LV SV:        71 ml  RIGHT VENTRICLE  IVC RV Basal diam:  4.00 cm     IVC diam: 1.80 cm RV S prime:     15.30 cm/s TAPSE (M-mode): 3.1 cm  LEFT ATRIUM             Index        RIGHT ATRIUM           Index LA diam:        3.60 cm 2.09 cm/m   RA Area:     18.60 cm LA Vol (A2C):   41.1 ml 23.89 ml/m  RA Volume:   51.40 ml  29.88 ml/m LA Vol (A4C):   39.0 ml 22.67 ml/m LA Biplane Vol: 41.7 ml 24.24 ml/m AORTIC VALVE LVOT Vmax:   151.00 cm/s LVOT Vmean:  97.100 cm/s LVOT VTI:    0.348 m  AORTA Ao Root diam: 2.50 cm Ao Asc diam:  3.10 cm  MITRAL VALVE               TRICUSPID VALVE MV Area (PHT): 3.21 cm    TR Peak grad:   20.8 mmHg MV Decel Time: 236 msec    TR Vmax:        228.00 cm/s MV E velocity: 91.70 cm/s MV A velocity: 81.20 cm/s  SHUNTS MV E/A ratio:  1.13        Systemic VTI:  0.35 m Systemic Diam: 1.90 cm  Toribio Fuel MD Electronically signed by Toribio Fuel MD Signature Date/Time: 06/13/2024/5:20:46 PM    Final    MONITORS  LONG TERM MONITOR-LIVE TELEMETRY (3-14 DAYS) 07/05/2024  Narrative Details: Patch Wear Time:  6 days and 18 hours (2025-10-28T12:53:21-0400 to 2025-11-04T07:08:40-0500). Patient had a min HR of 32 bpm, max HR of 185 bpm, and avg HR of 57 bpm.  Predominant Rhythm: Sinus bradycardia  Arrhythmias: There were 6 episodes of supraventricular tachycardia (SVT). The fastest run of SVT was 132 bpm. The longest run was 9 beats.  Ectopic Beats: There were rare premature atrial contractions (<1%) and occasional premature ventricular contractions (1.1%). Ventricular bigeminy was observed lasting 1 minute and 46  seconds. Trigeminy was observed lasting for 12 seconds.  Patient Triggered Events: There were 0 patient triggered events.   Impressions: Unremarkable heart monitor results with predominant sinus bradycardia. Adequate chronotropic response. Premature ventricular beats were occasional occurring 1.1% of the time. There were 6 episodes of SVT without associated symptoms.  Kelbi Renstrom T. Floretta HEATH, MD [place]  [facility] 07/12/2024 7:25 PM       ______________________________________________________________________________________________      Risk Assessment/Calculations:              Physical Exam:     VS:  BP 108/60 (BP Location: Left Arm, Patient Position: Sitting, Cuff Size: Normal)   Pulse 60   Ht 5' 5 (1.651 m)   Wt 146 lb (66.2 kg)   SpO2 97%   BMI 24.30 kg/m      Wt Readings from Last 3 Encounters:  05/30/24 144 lb (65.3 kg)  03/04/24 143 lb 6.4 oz (65 kg)  05/26/23 147 lb 6.4 oz (66.9 kg)     GEN: Well nourished, well developed, in no acute distress NECK: No JVD; No carotid bruits CARDIAC: RRR, no murmurs, rubs, gallops RESPIRATORY:  Clear to auscultation without rales, wheezing or rhonchi  ABDOMEN: Soft, non-tender, non-distended, normal bowel sounds EXTREMITIES:  Warm and well perfused, no edema; No deformity, 2+ radial pulses PSYCH: Normal mood and affect   Assessment & Plan Vasovagal syncope -  I am confident that the patient had a single episode of vasovagal syncope without recurrence. -Her workup thus far has been unrevealing and she is in excellent health. -No additional evaluation for her vasovagal syncope episode is needed at this time. Follow-up as needed Mixed hyperlipidemia - Last LDL mildly elevated at 117. - She is a very healthy woman, with really no major risk factors for CAD. - I recommend we get a CAC score to better understand what her cardiovascular risk is. - Depending on the results then we can discuss potential  lipid-lowering therapies. CAC score          This note was written with the assistance of a dictation microphone or AI dictation software. Please excuse any typos or grammatical errors.   Signed, Georganna Archer, MD 07/16/2024 1:04 PM    [facility]

## 2024-07-17 ENCOUNTER — Ambulatory Visit
Attending: Student in an Organized Health Care Education/Training Program | Admitting: Student in an Organized Health Care Education/Training Program

## 2024-07-17 ENCOUNTER — Encounter: Payer: Self-pay | Admitting: Student in an Organized Health Care Education/Training Program

## 2024-07-17 ENCOUNTER — Ambulatory Visit (HOSPITAL_COMMUNITY)
Admission: RE | Admit: 2024-07-17 | Discharge: 2024-07-17 | Disposition: A | Discharge status: Home / Self Care | Payer: Self-pay | Source: Ambulatory Visit | Attending: Student in an Organized Health Care Education/Training Program | Admitting: Student in an Organized Health Care Education/Training Program

## 2024-07-17 VITALS — BP 108/60 | HR 60 | Ht 65.0 in | Wt 146.0 lb

## 2024-07-17 DIAGNOSIS — R55 Syncope and collapse: Secondary | ICD-10-CM | POA: Diagnosis not present

## 2024-07-17 DIAGNOSIS — E782 Mixed hyperlipidemia: Secondary | ICD-10-CM

## 2024-07-17 NOTE — Assessment & Plan Note (Signed)
-   Last LDL mildly elevated at 117. - She is a very healthy woman, with really no major risk factors for CAD. - I recommend we get a CAC score to better understand what her cardiovascular risk is. - Depending on the results then we can discuss potential lipid-lowering therapies. CAC score

## 2024-07-17 NOTE — Patient Instructions (Signed)
 Medication Instructions:  Your physician recommends that you continue on your current medications as directed. Please refer to the Current Medication list given to you today.  *If you need a refill on your cardiac medications before your next appointment, please call your pharmacy*  Lab Work NONE ordered at this time of appointment   Testing/Procedures: Calcium Scoring Test  Follow-Up: At [facility], you and your health needs are our priority.  As part of our continuing mission to provide you with exceptional heart care, our providers are all part of one team.  This team includes your primary Cardiologist (physician) and Advanced Practice Providers or APPs (Physician Assistants and Nurse Practitioners) who all work together to provide you with the care you need, when you need it.  Your next appointment:    As Needed  Provider:   Dr. Floretta  We recommend signing up for the patient portal called MyChart.  Sign up information is provided on this After Visit Summary.  MyChart is used to connect with patients for Virtual Visits (Telemedicine).  Patients are able to view lab/test results, encounter notes, upcoming appointments, etc.  Non-urgent messages can be sent to your provider as well.   To learn more about what you can do with MyChart, go to [email or web address].   Other Instructions Coronary Calcium Scan A coronary calcium scan is an imaging test used to look for deposits of plaque in the inner lining of the blood vessels of the heart (coronary arteries). Plaque is made up of calcium, protein, and fatty substances. These deposits of plaque can partly clog and narrow the coronary arteries without producing any symptoms or warning signs. This puts a person at risk for a heart attack. A coronary calcium scan is performed using a computed tomography (CT) scanner machine without using a dye (contrast). This test is recommended for people who are at moderate risk for heart  disease. The test can find plaque deposits before symptoms develop. Tell a health care provider about: Any allergies you have. All medicines you are taking, including vitamins, herbs, eye drops, creams, and over-the-counter medicines. Any problems you or family members have had with anesthetic medicines. Any bleeding problems you have. Any surgeries you have had. Any medical conditions you have. Whether you are pregnant or may be pregnant. What are the risks? Generally, this is a safe procedure. However, problems may occur, including: Harm to a pregnant woman and her unborn baby. This test involves the use of radiation. Radiation exposure can be dangerous to a pregnant woman and her unborn baby. If you are pregnant or think you may be pregnant, you should not have this procedure done. A slight increase in the risk of cancer. This is because of the radiation involved in the test. The amount of radiation from one test is similar to the amount of radiation you are naturally exposed to over one year. What happens before the procedure? Ask your health care provider for any specific instructions on how to prepare for this procedure. You may be asked to avoid products that contain caffeine, tobacco, or nicotine for 4 hours before the procedure. What happens during the procedure?  You will undress and remove any jewelry from your neck or chest. You may need to remove hearing aides and dentures. Women may need to remove their bras. You will put on a hospital gown. Sticky electrodes will be placed on your chest. The electrodes will be connected to an electrocardiogram (ECG) machine to record a tracing of the electrical  activity of your heart. You will lie down on your back on a curved bed that is attached to the CT scanner. You may be given medicine to slow down your heart rate so that clear pictures can be created. You will be moved into the CT scanner, and the CT scanner will take pictures of your  heart. During this time, you will be asked to lie still and hold your breath for 10-20 seconds at a time while each picture of your heart is being taken. The procedure may vary among health care providers and hospitals. What can I expect after the procedure? You can return to your normal activities. It is up to you to get the results of your procedure. Ask your health care provider, or the department that is doing the procedure, when your results will be ready. Summary A coronary calcium scan is an imaging test used to look for deposits of plaque in the inner lining of the blood vessels of the heart. Plaque is made up of calcium, protein, and fatty substances. A coronary calcium scan is performed using a CT scanner machine without contrast. Generally, this is a safe procedure. Tell your health care provider if you are pregnant or may be pregnant. Ask your health care provider for any specific instructions on how to prepare for this procedure. You can return to your normal activities after the scan is done. This information is not intended to replace advice given to you by your health care provider. Make sure you discuss any questions you have with your health care provider. Document Revised: 07/18/2021 Document Reviewed: 07/18/2021 Elsevier Patient Education  2024 [employer].

## 2024-07-19 ENCOUNTER — Ambulatory Visit: Payer: Self-pay | Admitting: Student in an Organized Health Care Education/Training Program

## 2024-07-25 ENCOUNTER — Other Ambulatory Visit (HOSPITAL_BASED_OUTPATIENT_CLINIC_OR_DEPARTMENT_OTHER): Payer: Self-pay

## 2024-07-25 MED ORDER — COMIRNATY 30 MCG/0.3ML IM SUSY
0.3000 mL | PREFILLED_SYRINGE | Freq: Once | INTRAMUSCULAR | 0 refills | Status: AC
  Filled 2024-07-25: qty 0.3, 1d supply, fill #0

## 2025-06-03 ENCOUNTER — Encounter: Admitting: Student in an Organized Health Care Education/Training Program

## 2025-07-22 ENCOUNTER — Other Ambulatory Visit (HOSPITAL_COMMUNITY)
